# Patient Record
Sex: Male | Born: 1950 | Race: Black or African American | Hispanic: No | Marital: Married | Smoking: Current every day smoker
Health system: Southern US, Community
[De-identification: ages and names within clinical notes are randomized; demographics above are authoritative.]

## PROBLEM LIST (undated history)

## (undated) DIAGNOSIS — H269 Unspecified cataract: Secondary | ICD-10-CM

## (undated) DIAGNOSIS — K219 Gastro-esophageal reflux disease without esophagitis: Secondary | ICD-10-CM

## (undated) DIAGNOSIS — R911 Solitary pulmonary nodule: Secondary | ICD-10-CM

## (undated) DIAGNOSIS — I10 Essential (primary) hypertension: Secondary | ICD-10-CM

## (undated) HISTORY — DX: Gastro-esophageal reflux disease without esophagitis: K21.9

## (undated) HISTORY — DX: Solitary pulmonary nodule: R91.1

## (undated) HISTORY — PX: EYE SURGERY: SHX253

## (undated) HISTORY — DX: Essential (primary) hypertension: I10

## (undated) HISTORY — DX: Unspecified cataract: H26.9

---

## 1997-09-09 ENCOUNTER — Encounter: Admission: RE | Admit: 1997-09-09 | Discharge: 1997-09-09 | Payer: Self-pay | Admitting: Family Medicine

## 1997-10-11 ENCOUNTER — Encounter: Admission: RE | Admit: 1997-10-11 | Discharge: 1997-10-11 | Payer: Self-pay | Admitting: Sports Medicine

## 1997-10-21 ENCOUNTER — Ambulatory Visit (HOSPITAL_COMMUNITY): Admission: RE | Admit: 1997-10-21 | Discharge: 1997-10-21 | Payer: Self-pay | Admitting: Family Medicine

## 1997-11-10 ENCOUNTER — Encounter: Admission: RE | Admit: 1997-11-10 | Discharge: 1997-11-10 | Payer: Self-pay | Admitting: Family Medicine

## 1997-12-13 ENCOUNTER — Encounter: Admission: RE | Admit: 1997-12-13 | Discharge: 1997-12-13 | Payer: Self-pay | Admitting: Sports Medicine

## 1998-11-02 ENCOUNTER — Encounter: Admission: RE | Admit: 1998-11-02 | Discharge: 1998-11-02 | Payer: Self-pay | Admitting: Family Medicine

## 1998-12-05 ENCOUNTER — Encounter: Admission: RE | Admit: 1998-12-05 | Discharge: 1998-12-05 | Payer: Self-pay | Admitting: Sports Medicine

## 1999-09-27 ENCOUNTER — Encounter: Admission: RE | Admit: 1999-09-27 | Discharge: 1999-09-27 | Payer: Self-pay | Admitting: Family Medicine

## 2000-02-26 ENCOUNTER — Encounter: Admission: RE | Admit: 2000-02-26 | Discharge: 2000-02-26 | Payer: Self-pay | Admitting: Sports Medicine

## 2000-02-29 ENCOUNTER — Encounter: Admission: RE | Admit: 2000-02-29 | Discharge: 2000-02-29 | Payer: Self-pay | Admitting: Family Medicine

## 2000-12-31 ENCOUNTER — Encounter: Admission: RE | Admit: 2000-12-31 | Discharge: 2000-12-31 | Payer: Self-pay | Admitting: Family Medicine

## 2001-01-14 ENCOUNTER — Encounter: Admission: RE | Admit: 2001-01-14 | Discharge: 2001-01-14 | Payer: Self-pay | Admitting: Family Medicine

## 2001-03-20 ENCOUNTER — Encounter: Admission: RE | Admit: 2001-03-20 | Discharge: 2001-03-20 | Payer: Self-pay | Admitting: Family Medicine

## 2001-04-24 ENCOUNTER — Encounter: Admission: RE | Admit: 2001-04-24 | Discharge: 2001-04-24 | Payer: Self-pay | Admitting: Family Medicine

## 2001-07-06 ENCOUNTER — Encounter: Payer: Self-pay | Admitting: Emergency Medicine

## 2001-07-06 ENCOUNTER — Emergency Department (HOSPITAL_COMMUNITY): Admission: EM | Admit: 2001-07-06 | Discharge: 2001-07-06 | Payer: Self-pay | Admitting: Emergency Medicine

## 2002-04-29 ENCOUNTER — Encounter: Admission: RE | Admit: 2002-04-29 | Discharge: 2002-04-29 | Payer: Self-pay | Admitting: Family Medicine

## 2002-05-06 ENCOUNTER — Encounter: Admission: RE | Admit: 2002-05-06 | Discharge: 2002-05-06 | Payer: Self-pay | Admitting: Family Medicine

## 2002-05-31 ENCOUNTER — Encounter: Admission: RE | Admit: 2002-05-31 | Discharge: 2002-05-31 | Payer: Self-pay | Admitting: Family Medicine

## 2002-12-20 ENCOUNTER — Encounter: Admission: RE | Admit: 2002-12-20 | Discharge: 2002-12-20 | Payer: Self-pay | Admitting: Family Medicine

## 2003-03-19 ENCOUNTER — Emergency Department (HOSPITAL_COMMUNITY): Admission: EM | Admit: 2003-03-19 | Discharge: 2003-03-20 | Payer: Self-pay | Admitting: Emergency Medicine

## 2003-03-26 ENCOUNTER — Emergency Department (HOSPITAL_COMMUNITY): Admission: AD | Admit: 2003-03-26 | Discharge: 2003-03-26 | Payer: Self-pay | Admitting: Family Medicine

## 2005-07-04 ENCOUNTER — Ambulatory Visit: Payer: Self-pay | Admitting: Sports Medicine

## 2005-07-25 ENCOUNTER — Ambulatory Visit: Payer: Self-pay | Admitting: Family Medicine

## 2006-04-10 DIAGNOSIS — I10 Essential (primary) hypertension: Secondary | ICD-10-CM | POA: Insufficient documentation

## 2006-04-10 DIAGNOSIS — E7800 Pure hypercholesterolemia, unspecified: Secondary | ICD-10-CM | POA: Insufficient documentation

## 2006-04-10 DIAGNOSIS — M549 Dorsalgia, unspecified: Secondary | ICD-10-CM

## 2006-04-10 DIAGNOSIS — F17200 Nicotine dependence, unspecified, uncomplicated: Secondary | ICD-10-CM

## 2006-04-10 HISTORY — DX: Dorsalgia, unspecified: M54.9

## 2006-04-25 ENCOUNTER — Ambulatory Visit: Payer: Self-pay | Admitting: Family Medicine

## 2006-07-11 ENCOUNTER — Telehealth (INDEPENDENT_AMBULATORY_CARE_PROVIDER_SITE_OTHER): Payer: Self-pay | Admitting: Family Medicine

## 2006-10-12 ENCOUNTER — Ambulatory Visit: Payer: Self-pay | Admitting: Family Medicine

## 2006-10-12 ENCOUNTER — Encounter: Payer: Self-pay | Admitting: Emergency Medicine

## 2006-10-12 ENCOUNTER — Observation Stay (HOSPITAL_COMMUNITY): Admission: EM | Admit: 2006-10-12 | Discharge: 2006-10-13 | Payer: Self-pay | Admitting: Family Medicine

## 2006-10-16 ENCOUNTER — Ambulatory Visit: Payer: Self-pay | Admitting: Family Medicine

## 2006-10-16 DIAGNOSIS — F528 Other sexual dysfunction not due to a substance or known physiological condition: Secondary | ICD-10-CM | POA: Insufficient documentation

## 2006-10-16 HISTORY — DX: Other sexual dysfunction not due to a substance or known physiological condition: F52.8

## 2006-10-17 ENCOUNTER — Telehealth: Payer: Self-pay | Admitting: *Deleted

## 2006-10-17 ENCOUNTER — Encounter: Payer: Self-pay | Admitting: *Deleted

## 2007-04-08 ENCOUNTER — Emergency Department (HOSPITAL_COMMUNITY): Admission: EM | Admit: 2007-04-08 | Discharge: 2007-04-08 | Payer: Self-pay | Admitting: Emergency Medicine

## 2007-08-29 ENCOUNTER — Emergency Department (HOSPITAL_COMMUNITY): Admission: EM | Admit: 2007-08-29 | Discharge: 2007-08-30 | Payer: Self-pay | Admitting: Emergency Medicine

## 2007-11-02 ENCOUNTER — Emergency Department (HOSPITAL_COMMUNITY): Admission: EM | Admit: 2007-11-02 | Discharge: 2007-11-02 | Payer: Self-pay | Admitting: Emergency Medicine

## 2007-11-12 ENCOUNTER — Ambulatory Visit: Payer: Self-pay | Admitting: Family Medicine

## 2007-11-12 ENCOUNTER — Encounter (INDEPENDENT_AMBULATORY_CARE_PROVIDER_SITE_OTHER): Payer: Self-pay | Admitting: Family Medicine

## 2007-11-19 ENCOUNTER — Encounter: Payer: Self-pay | Admitting: *Deleted

## 2007-11-19 LAB — CONVERTED CEMR LAB
ALT: <8 units/L (ref 0–53)
AST: 13 units/L (ref 0–37)
Albumin: 4.2 g/dL (ref 3.5–5.2)
Alkaline Phosphatase: 101 units/L (ref 39–117)
BUN: 12 mg/dL (ref 6–23)
CO2: 25 meq/L (ref 19–32)
Calcium: 10 mg/dL (ref 8.4–10.5)
Chloride: 107 meq/L (ref 96–112)
Cholesterol: 251 mg/dL — ABNORMAL HIGH (ref 0–200)
Creatinine, Ser: 0.89 mg/dL (ref 0.40–1.50)
Glucose, Bld: 88 mg/dL (ref 70–99)
HDL: 42 mg/dL (ref >39)
LDL Cholesterol: 189 mg/dL — ABNORMAL HIGH (ref 0–99)
PSA: 3.9 ng/mL (ref 0.10–4.00)
Potassium: 3.7 meq/L (ref 3.5–5.3)
Sodium: 143 meq/L (ref 135–145)
Total Bilirubin: 1 mg/dL (ref 0.3–1.2)
Total CHOL/HDL Ratio: 6
Total Protein: 7 g/dL (ref 6.0–8.3)
Triglycerides: 100 mg/dL (ref <150)
VLDL: 20 mg/dL (ref 0–40)

## 2008-08-23 ENCOUNTER — Ambulatory Visit: Payer: Self-pay | Admitting: Family Medicine

## 2008-08-23 DIAGNOSIS — K219 Gastro-esophageal reflux disease without esophagitis: Secondary | ICD-10-CM | POA: Insufficient documentation

## 2009-03-29 ENCOUNTER — Ambulatory Visit: Payer: Self-pay | Admitting: Family Medicine

## 2009-03-29 ENCOUNTER — Encounter: Payer: Self-pay | Admitting: Family Medicine

## 2009-03-29 LAB — CONVERTED CEMR LAB
ALT: 9 units/L (ref 0–53)
AST: 21 units/L (ref 0–37)
Albumin: 4.4 g/dL (ref 3.5–5.2)
Alkaline Phosphatase: 99 units/L (ref 39–117)
BUN: 6 mg/dL (ref 6–23)
CO2: 25 meq/L (ref 19–32)
Calcium: 10.2 mg/dL (ref 8.4–10.5)
Chloride: 103 meq/L (ref 96–112)
Cholesterol: 259 mg/dL — ABNORMAL HIGH (ref 0–200)
Creatinine, Ser: 0.97 mg/dL (ref 0.40–1.50)
Glucose, Bld: 83 mg/dL (ref 70–99)
HDL: 53 mg/dL (ref >39)
LDL Cholesterol: 194 mg/dL — ABNORMAL HIGH (ref 0–99)
PSA: 1.91 ng/mL (ref 0.10–4.00)
Potassium: 4.6 meq/L (ref 3.5–5.3)
Sodium: 140 meq/L (ref 135–145)
Total Bilirubin: 1.2 mg/dL (ref 0.3–1.2)
Total CHOL/HDL Ratio: 4.9
Total Protein: 7 g/dL (ref 6.0–8.3)
Triglycerides: 61 mg/dL (ref <150)
VLDL: 12 mg/dL (ref 0–40)

## 2009-04-09 ENCOUNTER — Encounter: Payer: Self-pay | Admitting: Family Medicine

## 2009-04-10 ENCOUNTER — Telehealth: Payer: Self-pay | Admitting: *Deleted

## 2009-04-26 ENCOUNTER — Ambulatory Visit: Payer: Self-pay | Admitting: Family Medicine

## 2009-11-15 ENCOUNTER — Encounter: Payer: Self-pay | Admitting: Psychology

## 2009-11-15 ENCOUNTER — Encounter: Payer: Self-pay | Admitting: Family Medicine

## 2009-11-15 ENCOUNTER — Ambulatory Visit: Payer: Self-pay | Admitting: Family Medicine

## 2009-11-15 DIAGNOSIS — M25569 Pain in unspecified knee: Secondary | ICD-10-CM | POA: Insufficient documentation

## 2009-11-15 LAB — CONVERTED CEMR LAB
BUN: 8 mg/dL (ref 6–23)
CO2: 26 meq/L (ref 19–32)
Calcium: 9.8 mg/dL (ref 8.4–10.5)
Chloride: 104 meq/L (ref 96–112)
Creatinine, Ser: 0.85 mg/dL (ref 0.40–1.50)
Direct LDL: 207 mg/dL — ABNORMAL HIGH
Glucose, Bld: 91 mg/dL (ref 70–99)
Potassium: 3.4 meq/L — ABNORMAL LOW (ref 3.5–5.3)
Sodium: 139 meq/L (ref 135–145)

## 2009-11-16 ENCOUNTER — Encounter: Payer: Self-pay | Admitting: Family Medicine

## 2009-11-16 ENCOUNTER — Telehealth: Payer: Self-pay | Admitting: Family Medicine

## 2009-11-19 ENCOUNTER — Emergency Department (HOSPITAL_COMMUNITY): Admission: EM | Admit: 2009-11-19 | Discharge: 2009-11-20 | Payer: Self-pay | Admitting: Emergency Medicine

## 2009-11-29 ENCOUNTER — Emergency Department (HOSPITAL_COMMUNITY): Admission: EM | Admit: 2009-11-29 | Discharge: 2009-11-29 | Payer: Self-pay | Admitting: Emergency Medicine

## 2009-12-21 ENCOUNTER — Observation Stay (HOSPITAL_COMMUNITY): Admission: EM | Admit: 2009-12-21 | Discharge: 2009-12-22 | Payer: Self-pay | Admitting: Emergency Medicine

## 2009-12-21 ENCOUNTER — Encounter: Payer: Self-pay | Admitting: Family Medicine

## 2009-12-26 ENCOUNTER — Encounter: Payer: Self-pay | Admitting: Family Medicine

## 2009-12-26 LAB — CONVERTED CEMR LAB
HDL: 46 mg/dL
Hemoglobin: 18.4 g/dL
Hgb A1c MFr Bld: 5.6 %
LDL Cholesterol: 157 mg/dL
Potassium: 4.4 meq/L
Sodium: 137 meq/L
TSH: 2.3 microintl units/mL

## 2009-12-28 ENCOUNTER — Encounter: Payer: Self-pay | Admitting: Family Medicine

## 2009-12-28 ENCOUNTER — Ambulatory Visit: Payer: Self-pay | Admitting: Family Medicine

## 2009-12-28 LAB — CONVERTED CEMR LAB
ALT: 11 units/L (ref 0–53)
AST: 11 units/L (ref 0–37)
Alkaline Phosphatase: 105 units/L (ref 39–117)
BUN: 11 mg/dL (ref 6–23)
Creatinine, Ser: 0.87 mg/dL (ref 0.40–1.50)
Potassium: 3.4 meq/L — ABNORMAL LOW (ref 3.5–5.3)

## 2010-01-02 ENCOUNTER — Encounter (INDEPENDENT_AMBULATORY_CARE_PROVIDER_SITE_OTHER): Payer: Self-pay | Admitting: *Deleted

## 2010-01-03 ENCOUNTER — Encounter: Payer: Self-pay | Admitting: *Deleted

## 2010-01-19 ENCOUNTER — Ambulatory Visit (HOSPITAL_COMMUNITY)
Admission: RE | Admit: 2010-01-19 | Discharge: 2010-01-19 | Payer: Self-pay | Source: Home / Self Care | Attending: Family Medicine | Admitting: Family Medicine

## 2010-01-19 ENCOUNTER — Ambulatory Visit: Payer: Self-pay | Admitting: Family Medicine

## 2010-01-23 ENCOUNTER — Ambulatory Visit: Payer: Self-pay

## 2010-01-26 ENCOUNTER — Encounter: Payer: Self-pay | Admitting: Family Medicine

## 2010-03-13 NOTE — Letter (Signed)
Summary: Generic Letter  [facility]  [address]   [place], [place] [address]   Phone: [phone]  Fax: [phone]    11/16/2009  [facility] [address] [place], [place]  [address]  Dear Mr. WITTLER,   Your ncholesterol level was high.  Your LDL (bad cholesterol) was 751.  We want it to be less than 130 in you.  I have changed your cholesterol medicine to Lipitor 40mg . I would like you to go to your pharmacy and pick up this medication.  It is name brand only for the next 3 months.  Soon it will be generic.  Call me back when you get this medication as we need to talk about how to reduce your chances for heart attacks and strokes.     Sincerely,   [name] MD  Appended Document: Generic Letter mailed.

## 2010-03-13 NOTE — Letter (Signed)
Summary: Generic Letter  [facility]  [address]   [place], [place] [address]   Phone: [phone]  Fax: [phone]    01/03/2010 MRN: [id number]  [address] [place], [place]  [address]  Dear Mr. LAFAVOR,  You have been scheduled for an Exercise Treadmill Test with Dr. Denny Levy on Monday December 19th at 11:30 am.  The location of the test is at [facility] in the [facility].  Please review the enclosed instructions prior to your appointment and let us if you have any questions.         Sincerely,   Dennison Nancy RN [facility]

## 2010-03-13 NOTE — Assessment & Plan Note (Signed)
Summary: Behavioral Medicine Student Consultation   Primary Care Provider:  Eustaquio Boyden  MD   History of Present Illness: [name] is a 60 year old African American male with a history of smoking. Mr. [name] reported smoking up to two packs per day approximately three months ago. However, in these last three months, he has successfully reduced his smoking intake to one pack per day. Mr. [name] reports  that he has been successful in his smoking reduciton due to not smoking at home. He further reported that he needs to quit smoking for his health and because his granddaughter has been asking him to quit smoking. Mr. [name] reported quitting smoking for up to one month in the past, but started smoking again as his coworkers and friends are chronic smokers.   Allergies: No Known Drug Allergies   Impression & Recommendations:  Problem # 1:  TOBACCO DEPENDENCE (ICD-305.1) [name] reported only possessing three cigarettes currently and indicated these would be his last three. He is in the action phase of change and reports that his quit day is today. He was provided with a brochure for the smoking cessation class offered during the 4th Tuesday of every month and the 1-8--Quit-Now number. [name] was receptive to the informaton and reported that he will be attending the class and may use the number if he feels like he needs a cigarette in the moment.   [name] may benefit from continued follow-up regarding his smoking cessation. Specifically, discussing how smoking relates to his health and his granddaughters health may be particulalry helpful as he reports these are motivators for quitting. If [name] indicates diffulties smoking, discussion of how to manage his smoke intake around his coworkers and peers may be particularly helpful.   [name]  Behavioral Medicine Student  November 15, 2009 9:28 AM   Complete Medication List: 1)  Hydrochlorothiazide 25 Mg Tabs  (Hydrochlorothiazide) .... One by mouth qd 2)  Adult Aspirin Low Strength 81 Mg Tbdp (Aspirin) .... Take 1 every day 3)  Simvastatin 40 Mg Tabs (Simvastatin) .... Take 1 every day 4)  Levitra 10 Mg Tabs (Vardenafil hcl) .Marland Kitchen.. 1 by mouth daily prn

## 2010-03-13 NOTE — Progress Notes (Signed)
  Medications Added LIPITOR 40 MG TABS (ATORVASTATIN CALCIUM) 1 by mouth qhs       Phone Note Outgoing Call   Call placed by: [name] MD,  November 16, 2009 9:00 AM Summary of Call: Attempted to call about cholesterol results (LDL 200).  Phone number does not work.  Have changed cholesterol medication to lipitor 40.  Will send a letter.  Initial call taken by: [name] MD,  November 16, 2009 9:01 AM    New/Updated Medications: LIPITOR 40 MG TABS (ATORVASTATIN CALCIUM) 1 by mouth qhs Prescriptions: LIPITOR 40 MG TABS (ATORVASTATIN CALCIUM) 1 by mouth qhs  #30 x 6   Entered and Authorized by:   [name] MD   Signed by:   [name] MD on 11/16/2009   Method used:   [facility]        [facility] [phone]* (retail)       [address]       [place], [place]  [address]       Ph: [phone] or [phone]       Fax: [phone]   RxID:   [id number]

## 2010-03-13 NOTE — Assessment & Plan Note (Signed)
Summary: f.u HTN, HLD, tobacco   Vital Signs:  Patient profile:   60 year old male Height:      69.75 inches Weight:      171 pounds BMI:     24.80 Temp:     97.6 degrees F oral Pulse rate:   77 / minute BP sitting:   119 / 78  (right arm) Cuff size:   regular  Vitals Entered By: Tessie Fass CMA (April 26, 2009 9:00 AM) CC: F/U Is Patient Diabetic? No Pain Assessment Patient in pain? no        Primary Care Provider:  Eustaquio Boyden  MD  CC:  F/U.  History of Present Illness: CC: f/u HTN  1. HTN - comlpiant with meds, no chest pain/tightness, HA, SOB, leg swelling.  2. HLD - no myalgias, compliant with meds.  friend had heart attack, so Gailen wants to watch his cholesterol closely.  3. tobacco - 1/2 ppd.  has self cut down from 1 ppd.  not ready to set quit date.  Habits & Providers  Alcohol-Tobacco-Diet     Tobacco Status: current     Tobacco Counseling: to quit use of tobacco products     Cigarette Packs/Day: 0.5  Current Medications (verified): 1)  Hydrochlorothiazide 25 Mg  Tabs (Hydrochlorothiazide) .... One By Mouth Qd 2)  Adult Aspirin Low Strength 81 Mg  Tbdp (Aspirin) .... Take 1 Every Day 3)  Simvastatin 40 Mg  Tabs (Simvastatin) .... Take 1 Every Day 4)  Viagra 50 Mg Tabs (Sildenafil Citrate) .... Use One Pill As Directed 30 Min Prior To Relations  Allergies (verified): No Known Drug Allergies  Past History:  Past Medical History: Current Problems:  SCREENING FOR MALIGNANT NEOPLASM, PROSTATE (ICD-V76.44) ANTIHYPERLIPIDEMIC USE, LONG TERM (ICD-V58.69) CARBUNCLE/FURUNCLE NOS (ICD-680.9) TOBACCO DEPENDENCE (ICD-305.1) HYPERTENSION, BENIGN SYSTEMIC (ICD-401.1) HYPERCHOLESTEROLEMIA (ICD-272.0) BACK PAIN, LOW (ICD-724.2) Cataracts  Past Surgical History: Exercise Stress Test: low probability of signif CAD; achieved 12.3 METS - 10/12/1997 Flex sig 02/2000, clear to 45 cm. h/o cataract surgery x3, keep coming back (last 2009) PMH-FH-SH  reviewed for relevance  Social History: Packs/Day:  0.5  Physical Exam  General:  Well-developed,well-nourished,in no acute distress; alert,appropriate and cooperative throughout examination Lungs:  Normal respiratory effort, chest expands symmetrically. Lungs are clear to auscultation, no crackles or wheezes. Heart:  Normal rate and regular rhythm. S1 and S2 normal without gallop, murmur, click, rub or other extra sounds. Extremities:  no edema   Impression & Recommendations:  Problem # 1:  HYPERTENSION, BENIGN SYSTEMIC (ICD-401.1)  optimal control on 25mg  HCTZ.  consider checking BMP next visit. His updated medication list for this problem includes:    Hydrochlorothiazide 25 Mg Tabs (Hydrochlorothiazide) ..... One by mouth qd  BP today: 119/78 Prior BP: 136/80 (03/29/2009)  Labs Reviewed: K+: 4.6 (03/29/2009) Creat: : 0.97 (03/29/2009)   Chol: 259 (03/29/2009)   HDL: 53 (03/29/2009)   LDL: 194 (03/29/2009)   TG: 61 (03/29/2009)  Orders: [facility]- Est  Level 4 ([address])  Problem # 2:  HYPERCHOLESTEROLEMIA (ICD-272.0)  reviewed last FLP and updated address. His updated medication list for this problem includes:    Simvastatin 40 Mg Tabs (Simvastatin) .Marland Kitchen... Take 1 every day  Labs Reviewed: SGOT: 21 (03/29/2009)   SGPT: 9 (03/29/2009)   HDL:53 (03/29/2009), 42 (11/12/2007)  LDL:194 (03/29/2009), 189 (11/12/2007)  Chol:259 (03/29/2009), 251 (11/12/2007)  Trig:61 (03/29/2009), 100 (11/12/2007)  Orders: [facility]- Est  Level 4 ([address])  Problem # 3:  TOBACCO DEPENDENCE (ICD-305.1)  Encouraged smoking  cessation  Orders: [facility]- Est  Level 4 ([address])  Problem # 4:  SPECIAL SCREENING FOR MALIGNANT NEOPLASMS COLON (ICD-V76.51) to return stool cards next visit (provided with cards again today)  Complete Medication List: 1)  Hydrochlorothiazide 25 Mg Tabs (Hydrochlorothiazide) .... One by mouth qd 2)  Adult Aspirin Low Strength 81 Mg Tbdp (Aspirin) .... Take 1 every day 3)  Simvastatin 40  Mg Tabs (Simvastatin) .... Take 1 every day 4)  Viagra 50 Mg Tabs (Sildenafil citrate) .... Use one pill as directed 30 min prior to relations  Patient Instructions: 1)  Please return in 3 months for follow up. 2)  Keep doing as well as you are with cutting back on smoking and your cholesterol and blood pressure medicines.   Prevention & Chronic Care Immunizations   Influenza vaccine: Fluvax 3+  (03/29/2009)   Influenza vaccine due: 10/12/2009    Tetanus booster: 06/11/2005: Done.   Tetanus booster due: 06/12/2015    Pneumococcal vaccine: Pneumovax  (03/29/2009)   Pneumococcal vaccine due: 03/29/2014    H. zoster vaccine: Not documented  Colorectal Screening   Hemoccult: Not documented   Hemoccult action/deferral: Ordered  (03/29/2009)    Colonoscopy: Not documented   Colonoscopy action/deferral: Refused  (03/29/2009)  Other Screening   PSA: 1.91  (03/29/2009)   Smoking status: current  (04/26/2009)   Smoking cessation counseling: yes  (04/25/2006)  Lipids   Total Cholesterol: 259  (03/29/2009)   LDL: 194  (03/29/2009)   LDL Direct: Not documented   HDL: 53  (03/29/2009)   Triglycerides: 61  (03/29/2009)    SGOT (AST): 21  (03/29/2009)   SGPT (ALT): 9  (03/29/2009)   Alkaline phosphatase: 99  (03/29/2009)   Total bilirubin: 1.2  (03/29/2009)    Lipid flowsheet reviewed?: Yes   Progress toward LDL goal: Improved  Hypertension   Last Blood Pressure: 119 / 78  (04/26/2009)   Serum creatinine: 0.97  (03/29/2009)   Serum potassium 4.6  (03/29/2009)    Hypertension flowsheet reviewed?: Yes   Progress toward BP goal: At goal  Self-Management Support :   Personal Goals (by the next clinic visit) :      Personal blood pressure goal: 140/90  (03/29/2009)     Personal LDL goal: 130  (03/29/2009)    Hypertension self-management support: Not documented    Lipid self-management support: Lipid monitoring log, Written self-care plan, Education handout  (03/29/2009)

## 2010-03-13 NOTE — Assessment & Plan Note (Signed)
Summary: hospital f/u   Vital Signs:  Patient profile:   60 year old male Height:      69.75 inches Weight:      168 pounds BMI:     24.37 Pulse rate:   78 / minute BP sitting:   125 / 76  (right arm) Cuff size:   regular  Vitals Entered By: Tessie Fass CMA (December 28, 2009 11:00 AM) CC: hospital f/u Is Patient Diabetic? No   Primary Care Provider:  [name] MD  CC:  hospital f/u.  History of Present Illness: [name] presents to clinic today to follow up his recent hospitilization.   He was seen at [facility] for chest pain. His pain was exertional and releived by nitroglycerin. However his workup was negative for CAD and his pain was though to be due to GERD. He feels well after discharge and is now on Protonix. He has not had recurrence of chest pain.   No complaints today.   HTN: Not measuring BP. Taking HCTZ. No headache, vision changes, syncope.  Lipids: No myalgias. Currently taking 40mg  lipitor daily.  Smoking: Has quit at the hospitilization. Using nicotine gum.   Habits & Providers  Alcohol-Tobacco-Diet     Alcohol drinks/day: 0     Tobacco Status: quit < 6 months  Current Problems (verified): 1)  Chest Pain, Acute  (ICD-786.50) 2)  Knee Pain, Right  (ICD-719.46) 3)  Routine General Medical Exam@health  Care Facl  (ICD-V70.0) 4)  Gerd  (ICD-530.81) 5)  Tobacco Dependence  (ICD-305.1) 6)  Hypertension, Benign Systemic  (ICD-401.1) 7)  Hypercholesterolemia  (ICD-272.0) 8)  Special Screening For Malignant Neoplasms Colon  (ICD-V76.51) 9)  Fh of Hx, Family, Malignancy, Gi Tract  (ICD-V16.0) 10)  Erectile Dysfunction  (ICD-302.72) 11)  H/F Chest Pain, Atypical  (ICD-786.59) 12)  Screening For Malignant Neoplasm, Prostate  (ICD-V76.44) 13)  Back Pain, Low  (ICD-724.2)  Current Medications (verified): 1)  Hydrochlorothiazide 25 Mg  Tabs (Hydrochlorothiazide) .... One By Mouth Qd 2)  Adult Aspirin Low Strength 81 Mg  Tbdp (Aspirin) .... Take 1 Every Day 3)   Lipitor 80 Mg Tabs (Atorvastatin Calcium) .Marland Kitchen.. 1 By Mouth Qhs 4)  Polyethylene Glycol 3350  Powd (Polyethylene Glycol 3350) .Marland Kitchen.. 1 Cap 1-2 Time Daily As Needed For Constipation. Disp 1 Large Bottle 5)  Nitrostat 0.4 Mg Subl (Nitroglycerin) .Marland Kitchen.. 1 Sl Q15min X3 As Needed Chest Pain. Disp 1 Tube  Allergies (verified): No Known Drug Allergies  Past History:  Past Medical History: Last updated: 04/26/2009 Current Problems:  SCREENING FOR MALIGNANT NEOPLASM, PROSTATE (ICD-V76.44) ANTIHYPERLIPIDEMIC USE, LONG TERM (ICD-V58.69) CARBUNCLE/FURUNCLE NOS (ICD-680.9) TOBACCO DEPENDENCE (ICD-305.1) HYPERTENSION, BENIGN SYSTEMIC (ICD-401.1) HYPERCHOLESTEROLEMIA (ICD-272.0) BACK PAIN, LOW (ICD-724.2) Cataracts  Past Surgical History: Last updated: 04/26/2009 Exercise Stress Test: low probability of signif CAD; achieved 12.3 METS - 10/12/1997 Flex sig 02/2000, clear to 45 cm. h/o cataract surgery x3, keep coming back (last 2009)  Social History: Last updated: 12/28/2009 Lives with wife, 4 grandchildren; Has three children. ; H/o 3 PPD smoker, quit (Nov, 2011), interested in stopping smoking; Works on Veterinary surgeon.  No EtOH, no rec drugs.  Risk Factors: Smoking Status: quit < 6 months (12/28/2009) Packs/Day: 0.5 (12/21/2009)  Social History: Lives with wife, 4 grandchildren; Has three children. ; H/o 3 PPD smoker, quit (Nov, 2011), interested in stopping smoking; Works on Veterinary surgeon.  No EtOH, no rec drugs.Smoking Status:  quit < 6 months  Review of Systems  The patient denies anorexia, fever,  weight loss, chest pain, syncope, dyspnea on exertion, headaches, abdominal pain, severe indigestion/heartburn, and difficulty walking.    Physical Exam  General:  VS noted.  Well NAD Eyes:  h/o bilateral eye surgery for cataracts - implanted lens visible, erythema of conjunctiva which seems chronic in nature. Mouth:  MMM Lungs:  Normal respiratory effort, chest expands  symmetrically. Lungs are clear to auscultation, no crackles or wheezes. Heart:  Normal rate and regular rhythm. S1 and S2 normal without gallop, murmur, click, rub or other extra sounds. Abdomen:  Bowel sounds positive,abdomen soft and non-tender without masses, organomegaly or hernias noted. Extremities:  no edema Neurologic:  alert & oriented X3, cranial nerves II-XII intact, strength normal in all extremities, and sensation intact to light touch.     Impression & Recommendations:  Problem # 1:  CHEST PAIN, ACUTE (ICD-786.50) Assessment Improved  Follow up admission from chest pain.  I am not convinced this his pain is entirely due to GERD. I feel that he would benifit from further risk factor stratification. Will send for exercise stress test as the patient states that he can run and has a normal EKG.  Nitroglycerine SL PRn and stop levitra. Pt expressed understanding.  Will follow up in 1 month.   Orders: [facility]- Est  Level 4 ([address])  Problem # 2:  GERD (ICD-530.81) Assessment: Improved  Will continue with protonix as the patient perfers it and it seems to work better than omeprazole. Will ask pt to ask the pharmacist which high potency PPI is perferred in his plan.  No red flags for esophageal cancer at this time. May consider EGD in the future.  Will follow up in 1 month.   Orders: [facility]- Est  Level 4 ([address])  Problem # 3:  TOBACCO DEPENDENCE (ICD-305.1) Assessment: Improved  Has currently stoped smoking. However we will continue to ask about it.  Offered reassurance and other agents. Happy with nicotine gum and quit line.  Follow up in 1 month.   Orders: [facility]- Est  Level 4 ([address])  Problem # 4:  HYPERTENSION, BENIGN SYSTEMIC (ICD-401.1) Assessment: Unchanged No changes planned.   His updated medication list for this problem includes:    Hydrochlorothiazide 25 Mg Tabs (Hydrochlorothiazide) ..... One by mouth qd  BP today: 125/76 Prior BP: 135/78 (12/21/2009)  Labs  Reviewed: K+: 3.4 (11/15/2009) Creat: : 0.85 (11/15/2009)   Chol: 259 (03/29/2009)   HDL: 53 (03/29/2009)   LDL: 194 (03/29/2009)   TG: 61 (03/29/2009)  Problem # 5:  HYPERCHOLESTEROLEMIA (ICD-272.0) Assessment: Improved LDL imoroved from the last check. However not at goal of <130. Will increase to 80mg .  Will also check CMP today to assess liver enzymes.   His updated medication list for this problem includes:    Lipitor 80 Mg Tabs (Atorvastatin calcium) .Marland Kitchen... 1 by mouth qhs  Orders: Comp Met-[facility] ([address]08-65784) [facility]- Est  Level 4 ([address])  Complete Medication List: 1)  Hydrochlorothiazide 25 Mg Tabs (Hydrochlorothiazide) .... One by mouth qd 2)  Adult Aspirin Low Strength 81 Mg Tbdp (Aspirin) .... Take 1 every day 3)  Lipitor 80 Mg Tabs (Atorvastatin calcium) .Marland Kitchen.. 1 by mouth qhs 4)  Polyethylene Glycol 3350 Powd (Polyethylene glycol 3350) .Marland Kitchen.. 1 cap 1-2 time daily as needed for constipation. disp 1 large bottle 5)  Nitrostat 0.4 Mg Subl (Nitroglycerin) .Marland Kitchen.. 1 sl q21min x3 as needed chest pain. disp 1 tube  Patient Instructions: 1)  Thank you for seeing me today. 2)  Take the mirlax tiwce daily until  you have soft BMs.  Go get colace OTC to soften the stools. Also get ducolax suppositories and use that today to have a BM. 3)  Ask you pharmacist which proton pump inhibitor that is stronger than omeprazole is perferred for your insurance. 4)  Start taking lipitor 80mg  every day. 5)  Schedule an appoinmtnet for an exercise stress test with Dr. Darrick Penna or McDiarmid in the next few weeks.  6)  Stop taking levitra.  7)  Use the nitroglycerin under the tongue for chest pain. You can use it up to 3 times every 5 mins. If you need to use it 3 times in a row call 911. 8)  If you have chest pain, difficulty breathing, fevers over 102 that does not get better with tylenol please call us or see a doctor.  9)  Please schedule a follow-up appointment in 1 month.  Prescriptions: NITROSTAT 0.4 MG SUBL  (NITROGLYCERIN) 1 sl q8min x3 as needed chest pain. Disp 1 tube  #1 x 1   Entered and Authorized by:   [name] MD   Signed by:   [name] MD on 12/29/2009   Method used:   [facility]        [facility] [phone]* (retail)       [address], [place]  [address]       Ph: [phone] or [phone]       Fax: [phone]   RxID:   [id number] POLYETHYLENE GLYCOL 3350  POWD (POLYETHYLENE GLYCOL 3350) 1 cap 1-2 time daily as needed for constipation. Disp 1 large bottle  #1 x 99   Entered and Authorized by:   [name] MD   Signed by:   [name] MD on 12/28/2009   Method used:   [facility]        [facility] [phone]* (retail)       [address] [place]  [address]       Ph: [phone] or [phone]       Fax: [phone]   RxID:   [id number] LIPITOR 80 MG TABS (ATORVASTATIN CALCIUM) 1 by mouth qhs  #30 x 6   Entered and Authorized by:   [name] MD   Signed by:   [name] MD on 12/28/2009   Method used:   [facility] to        [facility] [phone]* (retail)       [address], [place]  [address]       Ph: [phone] or [phone]       Fax: [phone]   RxID:   [id number]    Orders Added: 1)  Comp Met-[facility] [[id number]] 2)  [facility]- Est  Level 4 [[address]]     -  Date:  12/26/2009    NA 137    K 4.4    CREAT 0.9    GLU 90    LDL 157    HDL 46    TG 103    HGB 18.4    TSH 2.3    HgBA1c 5.6

## 2010-03-13 NOTE — Assessment & Plan Note (Signed)
Summary: CPE   Vital Signs:  Patient profile:   60 year old male Height:      69.75 inches Weight:      165.2 pounds BMI:     23.96 Temp:     97.9 degrees F oral Pulse rate:   77 / minute BP sitting:   136 / 80  (right arm) Cuff size:   regular  Vitals Entered By: Garen Grams LPN (March 29, 2009 8:48 AM) Is Patient Diabetic? No   Primary Care Provider:  Eustaquio Boyden  MD   History of Present Illness: CC: CPE  h/o HTN, HLD.  Not on meds except regular aspirin.  states took wife's HCTZ this mornign because knew was coming into doctor, and he ran out of his own several months ago.  not taking simvastatin.  Comes in for physical.    Still smoking 1ppd.  precontemplative.  Habits & Providers  Alcohol-Tobacco-Diet     Tobacco Status: current     Cigarette Packs/Day: 1.0  Current Medications (verified): 1)  Hydrochlorothiazide 25 Mg  Tabs (Hydrochlorothiazide) .... One By Mouth Qd 2)  Adult Aspirin Low Strength 81 Mg  Tbdp (Aspirin) .... Take 1 Every Day 3)  Simvastatin 40 Mg  Tabs (Simvastatin) .... Take 1 Every Day  Allergies (verified): No Known Drug Allergies  Past History:  Past medical, surgical, family and social histories (including risk factors) reviewed for relevance to current acute and chronic problems.  Past Medical History: Reviewed history from 04/25/2006 and no changes required. Current Problems:  SCREENING FOR MALIGNANT NEOPLASM, PROSTATE (ICD-V76.44) ANTIHYPERLIPIDEMIC USE, LONG TERM (ICD-V58.69) CARBUNCLE/FURUNCLE NOS (ICD-680.9) TOBACCO DEPENDENCE (ICD-305.1) HYPERTENSION, BENIGN SYSTEMIC (ICD-401.1) HYPERCHOLESTEROLEMIA (ICD-272.0) BACK PAIN, LOW (ICD-724.2)  Past Surgical History: Reviewed history from 08/23/2008 and no changes required. Exercise Stress Test: low probability of signif CAD; achieved 12.3 METS - 10/12/1997 Flex sig 02/2000, clear to 45 cm.  Family History: Reviewed history from 08/23/2008 and no changes  required. Father died of prostate, colon, lung CA passed when 23s. Also HTN. Mother died of heart disease (smoker), IDDM, CAD, HTN, PGM--unknown cancer  Social History: Reviewed history from 08/23/2008 and no changes required. Lives with wife, 4 grandchildren; Has three children. ; H/o 3 PPD smoker, down to 2 PPD (2010), interested in stopping smoking; Works on Veterinary surgeon.  No EtOH, no rec drugs.Packs/Day:  1.0  Physical Exam  General:  Well-developed,well-nourished,in no acute distress; alert,appropriate and cooperative throughout examination Lungs:  normal respiratory effort.   distant breath sounds throughout, but no wheezes, no crackles. Heart:  Normal rate and regular rhythm. S1 and S2 normal without gallop, murmur, click, rub or other extra sounds. Abdomen:  Bowel sounds positive,abdomen soft and non-tender without masses, organomegaly or hernias noted. Extremities:  no edema Skin:  Intact without suspicious lesions or rashes   Impression & Recommendations:  Problem # 1:  ROUTINE GENERAL MEDICAL EXAM@HEALTH  CARE FACL (ICD-V70.0)  phsyical today.  received flu and PNA shots.  needs Pneumovax at age 55 for last time.  Orders: [facility] - Est  40-64 yrs ([address])  Problem # 2:  TOBACCO DEPENDENCE (ICD-305.1)  Encouraged smoking cessation.  advised to have come back once more interested in discussing.  Orders: [facility] - Est  40-64 yrs ([address])  Problem # 3:  HYPERTENSION, BENIGN SYSTEMIC (ICD-401.1) advised to restart HCTZ.   His updated medication list for this problem includes:    Hydrochlorothiazide 25 Mg Tabs (Hydrochlorothiazide) ..... One by mouth qd  Orders: Lipid-[facility] ([id number]) Comp Met-[facility] [phone])  [facility] - Est  40-64 yrs ([address])  BP today: 136/80 Prior BP: 127/77 (08/23/2008)  Labs Reviewed: K+: 3.7 (11/12/2007) Creat: : 0.89 (11/12/2007)   Chol: 251 (11/12/2007)   HDL: 42 (11/12/2007)   LDL: 189 (11/12/2007)   TG: 100 (11/12/2007)  Problem # 4:   HYPERCHOLESTEROLEMIA (ICD-272.0)  advised to restart simvastatin.   His updated medication list for this problem includes:    Simvastatin 40 Mg Tabs (Simvastatin) .Marland Kitchen... Take 1 every day  Orders: Lipid-[facility] ([id number]) [facility] - Est  40-64 yrs [phone])  Labs Reviewed: SGOT: 13 (11/12/2007)   SGPT: <8 U/L (11/12/2007)   HDL:42 (11/12/2007)  LDL:189 (11/12/2007)  Chol:251 (11/12/2007)  Trig:100 (11/12/2007)  Problem # 5:  SPECIAL SCREENING FOR MALIGNANT NEOPLASMS COLON (ICD-V76.51) h/o prostate and colon cancer in family member, to do hemoccult instead of colonoscopy. Orders: [facility] - Est  40-64 yrs ([address]) Hemoccult Cards (Take Home) (Hemoccult Cards)  Complete Medication List: 1)  Hydrochlorothiazide 25 Mg Tabs (Hydrochlorothiazide) .... One by mouth qd 2)  Adult Aspirin Low Strength 81 Mg Tbdp (Aspirin) .... Take 1 every day 3)  Simvastatin 40 Mg Tabs (Simvastatin) .... Take 1 every day  Other Orders: PSA-[facility] ([id number]) Flu Vaccine 33yrs + [phone]) Admin 1st Vaccine (557[address]) Admin 1st Vaccine [facility]) [phone]) Pneumococcal Vaccine (70623) Admin of Any Addtl Vaccine (76283) Admin of Any Addtl Vaccine (State) [phone])  Patient Instructions: 1)  Please return in 1 month for follow up of HTN. 2)  Restart your water pill for blood pressure (hydrochlorothiazide) and cholesterol medicine (Simvastatin) daily.  I think you need both of these. 3)  You received a flu shot and pneumonia shot today. 4)  Please return your stool cards.  We checked prostate level today. 5)  Keep thinking about cutting back or quitting your smoking.  When you are ready to start, let Pattricia Boss know and come back to see me. 6)  remember it is important to take your medicines daily.  When you run out, call your pharmacy.  bring all your medicines to your next visit. 7)  Call clinic with questions.  Pleasure to see you today! Prescriptions: SIMVASTATIN 40 MG  TABS (SIMVASTATIN) Take 1 every day  #32 x 3   Entered and  Authorized by:   Eustaquio Boyden  MD   Signed by:   Eustaquio Boyden  MD on 03/29/2009   Method used:   [facility]        [facility] [phone]* (retail)       [address]       [place], [place]  [address]       Ph: [phone] or [phone]       Fax: [phone]   RxID:   [id number] HYDROCHLOROTHIAZIDE 25 MG  TABS (HYDROCHLOROTHIAZIDE) one by mouth qd  #32 x 3   Entered and Authorized by:   Eustaquio Boyden  MD   Signed by:   Eustaquio Boyden  MD on 03/29/2009   Method used:   [facility] to        [facility] [phone]* (retail)       [address] [place]  [address]       Ph: [phone] or [phone]       Fax: [phone]   RxID:   [id number]     Prevention & Chronic Care Immunizations   Influenza vaccine: Fluvax 3+  (03/29/2009)   Influenza vaccine due: 10/12/2009  Tetanus booster: 06/11/2005: Done.   Tetanus booster due: 06/12/2015    Pneumococcal vaccine: Pneumovax  (03/29/2009)   Pneumococcal vaccine due: 03/29/2014    H. zoster vaccine: Not documented  Colorectal Screening   Hemoccult: Not documented   Hemoccult action/deferral: Ordered  (03/29/2009)    Colonoscopy: Not documented   Colonoscopy action/deferral: Refused  (03/29/2009)  Other Screening   PSA: 3.90  (11/12/2007)   PSA ordered.   Smoking status: current  (03/29/2009)   Smoking cessation counseling: yes  (04/25/2006)  Lipids   Total Cholesterol: 251  (11/12/2007)   LDL: 189  (11/12/2007)   LDL Direct: Not documented   HDL: 42  (11/12/2007)   Triglycerides: 100  (11/12/2007)    SGOT (AST): 13  (11/12/2007)   SGPT (ALT): <8 U/L  (11/12/2007) CMP ordered    Alkaline phosphatase: 101  (11/12/2007)   Total bilirubin: 1.0  (11/12/2007)    Lipid flowsheet reviewed?: Yes   Progress toward LDL goal: Unchanged  Hypertension   Last Blood Pressure: 136 / 80  (03/29/2009)   Serum creatinine: 0.89  (11/12/2007)   Serum potassium 3.7   (11/12/2007) CMP ordered     Hypertension flowsheet reviewed?: Yes   Progress toward BP goal: At goal  Self-Management Support :   Personal Goals (by the next clinic visit) :      Personal blood pressure goal: 140/90  (03/29/2009)     Personal LDL goal: 130  (03/29/2009)    Hypertension self-management support: Not documented    Lipid self-management support: Lipid monitoring log, Written self-care plan, Education handout  (03/29/2009)   Lipid self-care plan printed.   Lipid education handout printed   Nursing Instructions: Provide Hemoccult cards with instructions (see order)    Pneumovax Vaccine    Vaccine Type: Pneumovax    Site: left deltoid    Mfr: Merck    Dose: 0.5 ml    Route: IM    Given by: Garen Grams LPN    Exp. Date: 06/01/2010    Lot #: [id number]    VIS given: 09/09/95 version given March 29, 2009.  Influenza Vaccine    Vaccine Type: Fluvax 3+    Site: right deltoid    Mfr: GlaxoSmithKline    Dose: 0.5 ml    Route: IM    Given by: Garen Grams LPN    Exp. Date: 08/10/2009    Lot #: [id number]    VIS given: 09/04/06 version given March 29, 2009.  Flu Vaccine Consent Questions    Do you have a history of severe allergic reactions to this vaccine? no    Any prior history of allergic reactions to egg and/or gelatin? no    Do you have a sensitivity to the preservative Thimersol? no    Do you have a past history of Guillan-Barre Syndrome? no    Do you currently have an acute febrile illness? no    Have you ever had a severe reaction to latex? no    Vaccine information given and explained to patient? yes

## 2010-03-13 NOTE — Progress Notes (Signed)
----   Converted from flag ---- ---- 04/09/2009 3:06 PM, Eustaquio Boyden  MD wrote: please send letter to patient. ------------------------------  mailed.

## 2010-03-13 NOTE — Miscellaneous (Signed)
  pt schd to see dr Huntley Dec neal on 12/9/11at 11:30 for ETT at [facility].  will contact nurse to mail pt blue protocol form and to inform him of his appt.

## 2010-03-13 NOTE — Letter (Signed)
Summary: Results Follow-up Letter  [facility]  [address]   [place], [place] [address]   Phone: [phone]  Fax: [phone]    04/09/2009  [address] [place], [place]  [address]  Dear [name],   The following are the results of your recent test(s):  Test     Result     Your kidney and liver function was normal.  Your sugars were also normal.  Your prostate level was normal at 1.91.   _________________________________________________________ Cholesterol LDL(Bad cholesterol):     578     Your goal is less than:     130    HDL (Good cholesterol):   53     Your goal is more than:      50 _________________________________________________________ Other Tests: Your cholesterol levels were high.  This likely means that you will need to continue taking simvastatin (medicine for cholesterol) daily.  Please call clinic with questions.  _________________________________________________________   Sincerely,  Eustaquio Boyden  MD [facility]           Appended Document: Results Follow-up Letter Letter returned unable to forward.

## 2010-03-13 NOTE — Assessment & Plan Note (Signed)
Summary: meet new dr/kh   Vital Signs:  Patient profile:   60 year old male Height:      69.75 inches Weight:      171.1 pounds BMI:     24.82 Temp:     98.0 degrees F oral Pulse rate:   67 / minute BP sitting:   162 / 84  (left arm) Cuff size:   regular  Vitals Entered By: Garen Grams LPN (November 15, 2009 8:45 AM) CC: Meet New MD Is Patient Diabetic? No Pain Assessment Patient in pain? yes     Location: right knee   Primary Care Provider:  [name] MD  CC:  Meet New MD.  History of Present Illness: [name] presents to clinic today to discuss: Hypertension Medications: HCTZ25.  Ran out a few days ago.  In interum feels OK Headache:No Pre/Syncope:No Chest Pain, Palpitation, Dyspnea:No Home BP: Not checking.  Knee Pain: Right, worse with extended time on feet and walking. 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  Right knee hurts and sometimes gives out. Some clicking. No catching or locking. No swelling noted.   Smoking: Smoking 1/2 PPD down from 2PPD.  Smokes mostly at work out doors.  Thinking about quitting. Wants to quit due to some coughing and trouble breathing especially at work. Has managed to quit for 1 month in the past. No hemotopysis or weight loss.   Erectile Dysfuction: Takes viargra 1 hr prior to sex. Works some of the time.  Has had episodes when he was unable to obtain an errection despite using the medication correctly.  Complains about the cost of the viagra.    Habits & Providers  Alcohol-Tobacco-Diet     Alcohol drinks/day: 0     Tobacco Status: current     Tobacco Counseling: to quit use of tobacco products     Cigarette Packs/Day: 0.5  Current Problems (verified): 1)  Knee Pain, Right  (ICD-719.46) 2)  Routine General Medical Exam@health  Care Facl  (ICD-V70.0) 3)  Gerd  (ICD-530.81) 4)  Tobacco Dependence  (ICD-305.1) 5)  Hypertension, Benign Systemic  (ICD-401.1) 6)  Hypercholesterolemia  (ICD-272.0) 7)  Special Screening For  Malignant Neoplasms Colon  (ICD-V76.51) 8)  Fh of Hx, Family, Malignancy, Gi Tract  (ICD-V16.0) 9)  Erectile Dysfunction  (ICD-302.72) 10)  H/F Chest Pain, Atypical  (ICD-786.59) 11)  Screening For Malignant Neoplasm, Prostate  (ICD-V76.44) 12)  Back Pain, Low  (ICD-724.2)  Current Medications (verified): 1)  Hydrochlorothiazide 25 Mg  Tabs (Hydrochlorothiazide) .... One By Mouth Qd 2)  Adult Aspirin Low Strength 81 Mg  Tbdp (Aspirin) .... Take 1 Every Day 3)  Simvastatin 40 Mg  Tabs (Simvastatin) .... Take 1 Every Day 4)  Levitra 10 Mg Tabs (Vardenafil Hcl) .Marland Kitchen.. 1 By Mouth Daily Prn  Allergies (verified): No Known Drug Allergies  Past History:  Past Medical History: Last updated: 04/26/2009 Current Problems:  SCREENING FOR MALIGNANT NEOPLASM, PROSTATE (ICD-V76.44) ANTIHYPERLIPIDEMIC USE, LONG TERM (ICD-V58.69) CARBUNCLE/FURUNCLE NOS (ICD-680.9) TOBACCO DEPENDENCE (ICD-305.1) HYPERTENSION, BENIGN SYSTEMIC (ICD-401.1) HYPERCHOLESTEROLEMIA (ICD-272.0) BACK PAIN, LOW (ICD-724.2) Cataracts  Past Surgical History: Last updated: 04/26/2009 Exercise Stress Test: low probability of signif CAD; achieved 12.3 METS - 10/12/1997 Flex sig 02/2000, clear to 45 cm. h/o cataract surgery x3, keep coming back (last 2009)  Social History: Last updated: 11/15/2009 Lives with wife, 4 grandchildren; Has three children. ; H/o 3 PPD smoker, down to 1/2 PPD (2011), interested in stopping smoking; Works on Veterinary surgeon.  No EtOH, no rec  drugs.  Risk Factors: Smoking Status: current (11/15/2009) Packs/Day: 0.5 (11/15/2009)  Social History: Lives with wife, 4 grandchildren; Has three children. ; H/o 3 PPD smoker, down to 1/2 PPD (2011), interested in stopping smoking; Works on Veterinary surgeon.  No EtOH, no rec drugs.  Review of Systems  The patient denies anorexia, fever, weight loss, hoarseness, chest pain, syncope, headaches, hemoptysis, abdominal pain, melena, severe  indigestion/heartburn, muscle weakness, suspicious skin lesions, difficulty walking, and unusual weight change.    Physical Exam  General:  VS noted, Well male in NAD Head:  Normocephalic and atraumatic without obvious abnormalities. No apparent alopecia or balding. Eyes:  h/o bilateral eye surgery for cataracts - implanted lens visible, erythema of conjunctiva which seems chronic in nature. Nose:  External nasal examination shows no deformity or inflammation. Nasal mucosa are pink and moist without lesions or exudates. Mouth:  Oral mucosa and oropharynx without lesions or exudates. Lungs:  Normal respiratory effort, chest expands symmetrically. Lungs are clear to auscultation, no crackles or wheezes. Heart:  Normal rate and regular rhythm. S1 and S2 normal without gallop, murmur, click, rub or other extra sounds. Abdomen:  Bowel sounds positive,abdomen soft and non-tender without masses, organomegaly or hernias noted. Msk:  Right Knee: No effusion, or erythema.  Non-tender over all joint lines.  Full ROM. Neg Lochmans, or McMurey's. No laxity.  Extremities:  no edema Cervical Nodes:  No lymphadenopathy noted Axillary Nodes:  No palpable lymphadenopathy   Impression & Recommendations:  Problem # 1:  HYPERTENSION, BENIGN SYSTEMIC (ICD-401.1) Assessment Deteriorated BP elevated not on medication. Plan to restart HCTZ today and f/u BP in 1-2 months. May need two medications.  Will also obtain BMP and LDL today.  His updated medication list for this problem includes:    Hydrochlorothiazide 25 Mg Tabs (Hydrochlorothiazide) ..... 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  No apparent minescal injury on my exam today.  Advise NSAIDs, RICE therapy.  If worsening will gove steroid injection.  If locking will refer to ortho. Pt voices understanding.  His updated medication list for this problem includes:    Adult Aspirin Low Strength 81 Mg Tbdp (Aspirin) .Marland Kitchen... Take 1 every day  Problem # 3:  TOBACCO DEPENDENCE (ICD-305.1) Assessment: Unchanged Discussed tobacco cessation for 15 mins.  Gave resources for quitline. Info on quit classes in area. Planned quit date. If needs medication will provide.  Orders: Provider Misc Charge- [facility] (Misc)  Problem # 4:  ERECTILE DYSFUNCTION (ICD-302.72) Assessment: Unchanged Change to levitra as is cheaper at [facility]. Will follow up next visit.  His updated medication list for this problem includes:    Levitra 10 Mg Tabs (Vardenafil hcl) .Marland Kitchen... 1 by mouth daily prn  Complete Medication List: 1)  Hydrochlorothiazide 25 Mg Tabs (Hydrochlorothiazide) .... One by mouth qd 2)  Adult Aspirin Low Strength 81 Mg Tbdp (Aspirin) .... Take 1 every day 3)  Simvastatin 40 Mg Tabs (Simvastatin) .... Take 1 every day 4)  Levitra 10 Mg Tabs (Vardenafil hcl) .Marland Kitchen.. 1 by mouth daily prn  Other Orders: Influenza Vaccine MCR (09381) Admin 1st Vaccine (82993) [facility]- Est Level  3 [phone])  Patient Instructions: 1)  Thank you for seeing me today. 2)  Please take your Blood Pressure medicine every day. 3)  You can take 2 aleve twice a day for knee pain. 4)  it if hurts or starts to swell up you can come back for a shot in your knee. 5)  Please come back in 1-2 months for a blood pressure check. 6)  Try to cut back to 5 ciagarettes a day. Prescriptions: LEVITRA 10 MG TABS (VARDENAFIL HCL) 1 by mouth daily prn  #1 x 11   Entered and Authorized by:   [name] MD   Signed by:   [name] MD on 11/15/2009   Method used:   Print then Give to Patient   RxID:    [id number] VIAGRA 50 MG TABS (SILDENAFIL CITRATE) use one pill as directed 30 min prior to relations  #10 x 1   Entered and Authorized by:   [name] MD   Signed by:   [name] MD on 11/15/2009   Method used:   [facility]        [facility] [phone]* (retail)       [address] [place]  [address]       Ph: [phone] or [phone]       Fax: [phone]   RxID:   [id number] SIMVASTATIN 40 MG  TABS (SIMVASTATIN) Take 1 every day  #32 x 6   Entered and Authorized by:   [name] MD   Signed by:   [name] MD on 11/15/2009   Method used:   [facility] to        [facility] [phone]* (retail)       [address]       [place], [place]  [address]       Ph: [phone] or [phone]       Fax: [phone]   RxID:   [id number] HYDROCHLOROTHIAZIDE 25 MG  TABS (HYDROCHLOROTHIAZIDE) one by mouth qd  #32 x 16   Entered and Authorized by:   [name] MD   Signed by:   [name] MD on 11/15/2009   Method used:   [facility] to        [facility] [phone]* (retail)       [address], [place]  [address]       Ph: [phone] or [phone]       Fax: [phone]   RxID:   [id number]    Prevention & Chronic Care Immunizations   Influenza vaccine: Fluvax MCR  (11/15/2009)   Influenza vaccine due: 10/12/2009    Tetanus booster: 06/11/2005: Done.   Tetanus booster due: 06/12/2015    Pneumococcal vaccine: Pneumovax  (03/29/2009)   Pneumococcal vaccine due: 03/29/2014    H. zoster vaccine: Not documented  Colorectal Screening   Hemoccult: Not documented   Hemoccult action/deferral: Ordered  (03/29/2009)    Colonoscopy: Not documented   Colonoscopy action/deferral: Refused  (03/29/2009)  Other Screening   PSA: 1.91  (03/29/2009)   Smoking status: current  (11/15/2009)   Smoking cessation counseling: yes  (04/25/2006)  Lipids   Total Cholesterol: 259  (03/29/2009)   LDL: 194  (03/29/2009)    LDL Direct: Not documented   HDL: 53  (03/29/2009)   Triglycerides: 61  (03/29/2009)    SGOT (AST): 21  (03/29/2009)  SGPT (ALT): 9  (03/29/2009)   Alkaline phosphatase: 99  (03/29/2009)   Total bilirubin: 1.2  (03/29/2009)    Lipid flowsheet reviewed?: Yes   Progress toward LDL goal: Unchanged  Hypertension   Last Blood Pressure: 162 / 84  (11/15/2009)   Serum creatinine: 0.97  (03/29/2009)   Serum potassium 4.6  (03/29/2009)    Hypertension flowsheet reviewed?: Yes   Progress toward BP goal: Deteriorated  Self-Management Support :   Personal Goals (by the next clinic visit) :      Personal blood pressure goal: 140/90  (03/29/2009)     Personal LDL goal: 130  (03/29/2009)    Patient will work on the following items until the next clinic visit to reach self-care goals:     Medications and monitoring: take my medicines every day, check my blood pressure, weigh myself weekly  (11/15/2009)    Hypertension self-management support: Not documented    Lipid self-management support: Lipid monitoring log, Written self-care plan, Education handout  (03/29/2009)    Nursing Instructions: Give Flu vaccine today    Immunizations Administered:  Influenza Vaccine # 1:    Vaccine Type: Fluvax MCR    Site: left deltoid    Mfr: GlaxoSmithKline    Dose: 0.5 ml    Route: IM    Given by: Jone Baseman CMA    Exp. Date: 08/08/2010    Lot #: [id number]    VIS given: 09/05/09 version given November 15, 2009.  Flu Vaccine Consent Questions:    Do you have a history of severe allergic reactions to this vaccine? no    Any prior history of allergic reactions to egg and/or gelatin? no    Do you have a sensitivity to the preservative Thimersol? no    Do you have a past history of Guillan-Barre Syndrome? no    Do you currently have an acute febrile illness? no    Have you ever had a severe reaction to latex? no    Vaccine information given and explained to patient? yes

## 2010-03-13 NOTE — Assessment & Plan Note (Signed)
Summary: Admit H&P   Vital Signs:  Patient profile:   60 year old male O2 Sat:      99 % on Room air Temp:     97.7 degrees F oral Pulse rate:   77 / minute Pulse rhythm:   regular Resp:     16 per minute BP sitting:   135 / 78  (left arm)  O2 Flow:  Room air  Primary Care Provider:  [name] MD   History of Present Illness: [name] presents to clinic today with chest pain, located substernally worse with exertion and non radiating for the last week. The pain started as intermintent and felt like gas pain. It was releived by tums. However yesterday and today the pain worsened and became worse with exertion. He went to work today and his pain was worsened by shoveling.  His pain lessened with rest. In the ED his pain resolved with SL nitroglycerin.  He denies any fever, chills, cough, dyspnea, or dizzyness.   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 One By Mouth Qd 2)  Adult Aspirin Low Strength 81 Mg  Tbdp (Aspirin) .... Take 1 Every Day 3)  Levitra 10 Mg Tabs (Vardenafil Hcl) .Marland Kitchen.. 1 By Mouth Daily Prn 4)  Lipitor 40 Mg Tabs  (Atorvastatin Calcium) .Marland Kitchen.. 1 By Mouth Qhs  Allergies (verified): No Known Drug Allergies  Past History:  Past Medical History: Last updated: 04/26/2009 Current Problems:  SCREENING FOR MALIGNANT NEOPLASM, PROSTATE (ICD-V76.44) ANTIHYPERLIPIDEMIC USE, LONG TERM (ICD-V58.69) CARBUNCLE/FURUNCLE NOS (ICD-680.9) TOBACCO DEPENDENCE (ICD-305.1) HYPERTENSION, BENIGN SYSTEMIC (ICD-401.1) HYPERCHOLESTEROLEMIA (ICD-272.0) BACK PAIN, LOW (ICD-724.2) Cataracts  Family History: Last updated: 04/11/09 Father died of prostate, colon, lung CA passed when 3s. Also HTN. Mother died of heart disease (smoker), IDDM, CAD, HTN, PGM--unknown cancer  Social History: Last updated: 11/15/2009 Lives with wife, 4 grandchildren; Has three children. ; H/o 3 PPD smoker, down to 1/2 PPD (2011), interested in stopping smoking; Works on Veterinary surgeon.  No EtOH, no rec drugs.  Risk Factors: Smoking Status: current (12/21/2009) Packs/Day: 0.5 (12/21/2009)  Review of Systems       The patient complains of chest pain and dyspnea on exertion.  The patient denies anorexia, fever, weight loss, decreased hearing, hoarseness, syncope, headaches, abdominal pain, severe indigestion/heartburn, muscle weakness, difficulty walking, and abnormal bleeding.    Physical Exam  General:  VS noted.  Tired appearing male in NAD Head:  Normocephalic and atraumatic without obvious abnormalities. No apparent alopecia or balding. Eyes:  h/o bilateral eye surgery for cataracts - implanted lens visible, erythema of conjunctiva which seems chronic in nature. Ears:  no external deformities.  Nose:  no external deformity.   Mouth:  Oral mucosa and oropharynx without lesions or exudates. Lungs:  Normal respiratory effort, chest expands symmetrically. Lungs are clear to auscultation, no crackles or wheezes. Heart:  Normal rate and regular rhythm. S1 and S2 normal without gallop, murmur, click, rub or other extra  sounds. Abdomen:  Bowel sounds positive,abdomen soft and non-tender without masses, organomegaly or hernias noted. Extremities:  no edema Neurologic:  alert & oriented X3, cranial nerves II-XII intact, strength normal in all extremities, and sensation intact to light touch.   Cervical Nodes:  No lymphadenopathy noted Axillary Nodes:  No palpable lymphadenopathy Additional Exam:  Labs: 137/4.4 101/31 6/0.9<90 CXR: No acute findings.  POC CE: <0.05 EKG: Rate 81 Sinus regular. QRSd 80 QC 429. No ST changes   Impression & Recommendations:  Problem # 1:  CHEST PAIN, ACUTE (ICD-786.50) Assessment [place] for cardiac etiology based on age, male, smoker, LDL 200, and chest pain that is releived by nitroglycerin and rest.  Plan: Cycle cardiac enzymes and follow on tellemetry. EKG, fasting lipids and HbA1c and TSH in AM.  Will provide nitroglycerine if needed.  If pain becomes persistant will start heparin and call cardiology.  Problem # 2:  GERD (ICD-530.81) Could be source of chest pain.  Will provide protonix in  house and follow.   Problem # 3:  TOBACCO DEPENDENCE (ICD-305.1) Major risk factor for chest pain. Will obtain tobacco cessation consueling.   Problem # 4:  HYPERCHOLESTEROLEMIA (ICD-272.0) Will continue home liptor and check fasting lipids.  His updated medication list for this problem includes:    Lipitor 40 Mg Tabs (Atorvastatin calcium) .Marland Kitchen... 1 by mouth qhs  Problem # 5:  FEN GI Will keep NPO and provide IVF  Problem # 6:  Code Full code  Problem # 7:  PPX Heparin and protonix  Problem # 8:  Dispo When work up complete  Complete Medication List: 1)  Hydrochlorothiazide 25 Mg Tabs (Hydrochlorothiazide) .... One by mouth qd 2)  Adult Aspirin Low Strength 81 Mg Tbdp (Aspirin) .... Take 1 every day 3)  Levitra 10 Mg Tabs (Vardenafil hcl) .Marland Kitchen.. 1 by mouth daily prn 4)  Lipitor 40 Mg Tabs (Atorvastatin calcium) .Marland Kitchen.. 1 by mouth qhs

## 2010-03-15 NOTE — Assessment & Plan Note (Signed)
Summary: 11:30APPT,ETT PER [facility]   Vital Signs:  Patient profile:   61 year old male BP sitting:   135 / 80  Primary Care Provider:  [name] MD   History of Present Illness: Here for ETT symptoms 2 weeks ago were initially chest pain non exertional, partially relieved with tmns and gas-x. THen he had some pain with exertion and was admitted overnight for rule-out. Workup was negative and is here for ETT today. Has not had similar pain since discharge. Works in [employer] representative.  Exercise Tolerance Test Cardiovascular Risk History:      Positive major cardiovascular risk factors include male age over 78 years old, hyperlipidemia, and hypertension.  Negative major cardiovascular risk factors include no history of diabetes and non-tobacco-user status.        Further assessment for target organ damage reveals no history of ASHD, cardiac end-organ damage (CHF/LVH), stroke/TIA, peripheral vascular disease, renal insufficiency, or hypertensive retinopathy.    Baseline EKG:    Rhythm:     normal sinus    Rate:       88    Abn. ST segments:   no    Pathologic Q-waves:   no  Exercise Tolerance Test Results:    Stress Modality:     exercise-treadmill    Protocol:       Standard Bruce-maximal    MPHR (bpm):        159    85% MPHR (bpm):     135    Reached 85% MPHR       (min:sec):       142    Workload in METS:     10.1    ST Segment analysis:       At Rest:       normal ST segments-no evidence of significant ST depression       With Exercise:     no evidence of significant ST depression    Arrhythmia:             no    Angina during ETT:     absent (0)  Allergies: No Known Drug Allergies  Past History:  Past Medical History: Last updated: 04/26/2009 Current Problems:  SCREENING FOR MALIGNANT NEOPLASM, PROSTATE (ICD-V76.44) ANTIHYPERLIPIDEMIC USE, LONG TERM (ICD-V58.69) CARBUNCLE/FURUNCLE NOS (ICD-680.9) TOBACCO DEPENDENCE (ICD-305.1) HYPERTENSION, BENIGN SYSTEMIC  (ICD-401.1) HYPERCHOLESTEROLEMIA (ICD-272.0) BACK PAIN, LOW (ICD-724.2) Cataracts  Past Surgical History: Last updated: 04/26/2009 Exercise Stress Test: low probability of signif CAD; achieved 12.3 METS - 10/12/1997 Flex sig 02/2000, clear to 45 cm. h/o cataract surgery x3, keep coming back (last 2009)  Family History: Last updated: April 06, 2009 Father died of prostate, colon, lung CA passed when 2s. Also HTN. Mother died of heart disease (smoker), IDDM, CAD, HTN, PGM--unknown cancer  Social History: Last updated: 12/28/2009 Lives with wife, 4 grandchildren; Has three children. ; H/o 3 PPD smoker, quit (Nov, 2011), interested in stopping smoking; Works on Veterinary surgeon.  No EtOH, no rec drugs.   Impression & Recommendations:  Problem # 1:  CHEST PAIN (ICD-786.50)  Orders: ETT w/ interpretation and report (84696) Negative ETT  He has follow up with Dr Denyse Amass Notable he has NOT SMOKES in 2 weeks, does not plan to restart. His wife has quit as well.  Complete Medication List: 1)  Hydrochlorothiazide 25 Mg Tabs (Hydrochlorothiazide) .... One by mouth qd 2)  Adult Aspirin Low Strength 81 Mg Tbdp (Aspirin) .... Take 1 every day 3)  Lipitor 80 Mg Tabs (Atorvastatin calcium) .Marland KitchenMarland KitchenMarland Kitchen  1 by mouth qhs 4)  Polyethylene Glycol 3350 Powd (Polyethylene glycol 3350) .Marland Kitchen.. 1 cap 1-2 time daily as needed for constipation. disp 1 large bottle 5)  Nitrostat 0.4 Mg Subl (Nitroglycerin) .Marland Kitchen.. 1 sl q2min x3 as needed chest pain. disp 1 tube ETT w/ interpretation and report ([id number])  Cardiovascular Risk Assessment/Plan:      The patient's hypertensive risk group is category B: At least one risk factor (excluding diabetes) with no target organ damage.  His calculated 10 year risk of coronary heart disease is 14 %.  Today's blood pressure is 135/80.    Exercise Tolerance Test Assessment:    Quality of ETT:   diagnostic    ETT Interpretation:   normal-no evidence of ischemia by ST analysis     Recommendations:   Good effort, asymptomatic with no EKG changes conserning for ischemia. Reached and exceeded THR Please see hospital report for complete details. NEGATIVE ETT     Orders Added: 1)  ETT w/ interpretation and report [[id number]]       Impression & Recommendations:  Orders: ETT w/ interpretation and report ([id number])   Complete Medication List: 1)  Hydrochlorothiazide 25 Mg Tabs (Hydrochlorothiazide) .... One by mouth qd 2)  Adult Aspirin Low Strength 81 Mg Tbdp (Aspirin) .... Take 1 every day 3)  Lipitor 80 Mg Tabs (Atorvastatin calcium) .Marland Kitchen.. 1 by mouth qhs 4)  Polyethylene Glycol 3350 Powd (Polyethylene glycol 3350) .Marland Kitchen.. 1 cap 1-2 time daily as needed for constipation. disp 1 large bottle 5)  Nitrostat 0.4 Mg Subl (Nitroglycerin) .Marland Kitchen.. 1 sl q51min x3 as needed chest pain. disp 1 tube  Cardiovascular Risk Assessment/Plan:      The patient's hypertensive risk group is category B: At least one risk factor (excluding diabetes) with no target organ damage.  His calculated 10 year risk of coronary heart disease is 14 %.  Today's blood pressure is 135/80.    Exercise Tolerance Test Assessment:    Quality of ETT:   diagnostic    ETT Interpretation:   normal-no evidence of ischemia by ST analysis    Recommendations:   Good effort, asymptomatic with no EKG changes conserning for ischemia. Reached and exceeded THR Please see hospital report for complete details. NEGATIVE ETT

## 2010-03-15 NOTE — Miscellaneous (Signed)
   Clinical Lists Changes  Problems: Removed problem of CHEST PAIN (ICD-786.50) Removed problem of ROUTINE GENERAL MEDICAL EXAM@[facility] (ICD-V70.0) Removed problem of SPECIAL SCREENING FOR MALIGNANT NEOPLASMS COLON (ICD-V76.51) Removed problem of Hospitalized for  CHEST PAIN, ATYPICAL (ICD-786.59) Removed problem of SCREENING FOR MALIGNANT NEOPLASM, PROSTATE (ICD-V76.44)

## 2010-04-24 LAB — POCT I-STAT, CHEM 8
BUN: 6 mg/dL (ref 6–23)
Calcium, Ion: 1.17 mmol/L (ref 1.12–1.32)
Chloride: 101 mEq/L (ref 96–112)
Glucose, Bld: 90 mg/dL (ref 70–99)
TCO2: 31 mmol/L (ref 0–100)

## 2010-04-24 LAB — CARDIAC PANEL(CRET KIN+CKTOT+MB+TROPI)
CK, MB: 1.6 ng/mL (ref 0.3–4.0)
Relative Index: 1.1 (ref 0.0–2.5)
Relative Index: 1.1 (ref 0.0–2.5)
Total CK: 152 U/L (ref 7–232)
Troponin I: 0.01 ng/mL (ref 0.00–0.06)
Troponin I: 0.03 ng/mL (ref 0.00–0.06)

## 2010-04-24 LAB — POCT CARDIAC MARKERS
CKMB, poc: <1 ng/mL — ABNORMAL LOW (ref 1.0–8.0)
Troponin i, poc: <0.05 ng/mL (ref 0.00–0.09)

## 2010-04-24 LAB — TROPONIN I: Troponin I: 0.01 ng/mL (ref 0.00–0.06)

## 2010-04-24 LAB — LIPID PANEL
HDL: 46 mg/dL (ref >39)
VLDL: 21 mg/dL (ref 0–40)

## 2010-04-24 LAB — TSH: TSH: 2.301 u[IU]/mL (ref 0.350–4.500)

## 2010-05-23 ENCOUNTER — Other Ambulatory Visit: Payer: Self-pay | Admitting: Family Medicine

## 2010-05-23 ENCOUNTER — Encounter: Payer: Self-pay | Admitting: Family Medicine

## 2010-05-23 DIAGNOSIS — F528 Other sexual dysfunction not due to a substance or known physiological condition: Secondary | ICD-10-CM

## 2010-05-23 NOTE — Progress Notes (Signed)
Refill request for viagra.  He is also on nitroglycerin PRN. I cannot refill due to drug reactions.  I tried to call but was unable to reach [name]. Will send letter.   [name]

## 2010-06-10 ENCOUNTER — Inpatient Hospital Stay (INDEPENDENT_AMBULATORY_CARE_PROVIDER_SITE_OTHER)
Admission: RE | Admit: 2010-06-10 | Discharge: 2010-06-10 | Disposition: A | Discharge status: Home / Self Care | Payer: BC Managed Care – PPO | Source: Ambulatory Visit | Attending: Family Medicine | Admitting: Family Medicine

## 2010-06-10 DIAGNOSIS — S0100XA Unspecified open wound of scalp, initial encounter: Secondary | ICD-10-CM

## 2010-06-17 ENCOUNTER — Inpatient Hospital Stay (HOSPITAL_COMMUNITY)
Admission: RE | Admit: 2010-06-17 | Discharge: 2010-06-17 | Disposition: A | Discharge status: Home / Self Care | Payer: BC Managed Care – PPO | Source: Ambulatory Visit | Attending: Family Medicine | Admitting: Family Medicine

## 2010-06-26 NOTE — Discharge Summary (Signed)
[name], [name]             ACCOUNT NO.:  [id number]   MEDICAL RECORD NO.:  [id number]          PATIENT TYPE:  INP   LOCATION:  3705                         FACILITY:  [facility]   PHYSICIAN:  [name], MD      DATE OF BIRTH:  1951/01/02   DATE OF ADMISSION:  10/12/2006  DATE OF DISCHARGE:  10/13/2006                               DISCHARGE SUMMARY   REASON FOR ADMISSION:  Chest pain, rule out.   COURSE AND TREATMENT RENDERED:  [name] is a 60 year old African-  American male who presented with chest pain.  It was decided to give a  full rule out of unstable angina versus musculoskeletal versus GERD as  causes for his chest pain.  This was done particularly because he has a  history of smoking as well as hyperlipidemia and hypertension.  He was  started on an aspirin 325 mg p.o. q.day as well as Protonix 40 mg p.o.  b.i.d. as well as metoprolol 25 mg p.o. q.day.  He was also started on  ibuprofen 800 mg p.o. t.i.d. in the case this was musculoskeletal in  nature.  The Protonix was in case this was related to GERD.  After a  short time on the beta-blocker, he was found to be bradycardic into the  50s; thus, the beta-blocker was discontinued.  His HCTZ had initially  been discontinued in favor of the beta-blocker, but after  discontinuation of the beta-blocker, the HCTZ was restarted at 25 mg  p.o. q.day.  It was felt that PE was unlikely given lack of shortness of  breath and nature of chest pain.  It was also felt that cardiac cause  was unlikely as well, however cardiac enzymes x3 were drawn all of which  were negative.   PERTINENT LABORATORY DATA:  Initial cardiac panel shows total CK 129, CK-  MB 1.5, troponin I 0.01, repeat eight hours later shows total CK 103, CK-  MB 1.3, troponin I 0.01, and final cardiac enzymes show total creatinine  kinase 116, CK-MB 1.4, troponin I 0.01.   OTHER PERTINENT LAB:  Hemoglobin A1c 6.0, TSH 2.345.  Lipid profile:  Cholesterol 244,  triglycerides 145, HDL cholesterol 34, LDL cholesterol  181, VLDL cholesterol 29.  Comprehensive metabolic panel shows sodium  138, potassium 4.0, chloride 105, bicarb 26, glucose 102, BUN 12,  creatinine 1.01.  Total bilirubin 1.1, alkaline phosphatase 78, AST 12,  ALT 14, total protein 6.2, albumin 3.3, calcium 9.1 and CBC shows a  white count 6.5, hemoglobin and hematocrit 16.2 and 48, respectively,  and platelet count 283.   FINAL DIAGNOSES:  1. Chest pain, likely related to viral pleurisy.  2. Hyperlipidemia.  3. Hypertension.  4. Tobacco abuse.   CONDITION ON DISCHARGE:  Well.   DISCHARGE MEDICATIONS:  1. Aspirin 81 mg p.o. q.day.  2. Ibuprofen 600 mg p.o. q.6 hours p.r.n. pain.  3. Maalox 30 mL p.o. q.6 hours p.r.n.  4. Simvastatin 40 mg p.o. q.day; please note that this was started      instead of Lipitor for financial consideration.  5. Prilosec OTC 20 mg p.o.  q.day.  6. HCTZ 25 mg p.o. q.day.   INSTRUCTIONS:  1. Please follow up this week with Dr. Luz Brazen, your PCP, phone number      [phone].  2. Please also continue a low sodium, heart-healthy diet.  3. Also recommend that you quit smoking.  4. Would recommend followup as an outpatient with cardiology for      complete workup and would consider stress test in the future given      risk factors of hypertension, hyperlipidemia and smoking.      [name], MD  Electronically Signed     SA/MEDQ  D:  10/13/2006  T:  10/13/2006  Job:  [id number]

## 2010-06-26 NOTE — H&P (Signed)
NAME:  [name], [name]             ACCOUNT NO.:  [id number]   MEDICAL RECORD NO.:  [id number]          PATIENT TYPE:  INP   LOCATION:  3705                         FACILITY:  [facility]   PHYSICIAN:  Santiago Bumpers. Hensel, M.D.DATE OF BIRTH:  1950-05-25   DATE OF ADMISSION:  10/12/2006  DATE OF DISCHARGE:                              HISTORY & PHYSICAL   CHIEF COMPLAINT:  Chest pain.   HISTORY OF PRESENT ILLNESS:  The patient is a 60 year old African  American male who presents with acute and sudden chest pressure at 7:00  a.m. this morning awakening him from sleep.  The patient had a feeling  of someone sitting on his chest as well as sharp pains on the left  lateral chest wall.  The patient said it hurt when took a deep breath.  To patient took two baby aspirin and the episode self-resolved.  The  patient states it reoccurred at rest when he was moving to the den.  After he had moved to the den, it suddenly occurred and lasted for about  10 seconds but it was severe and said that he got very short of breath.  He never had nauseous, no numbness or tingling, no weakness down the  left arm or right arm but just had difficulty breathing during the chest  pain due to the severity.  The patient states he had one time prior with  something similar but it was relieved by Tums.  He is a smoker.  He  smokes one and a half packs per day.  He was previously a three-pack-per-  day smoker.  He is interested in quitting now.  He does have a history  of hyperlipidemia, but no history of MI.  His risk factors include he is  shortness of breath at baseline with normal activity.  Positive tobacco.  No history of MI in a male less than 29 or male less than 65.  He is  on antihypertensive meds.   REVIEW OF SYSTEMS:  CONSTITUTIONAL:  No fever or chills.  CARDIOVASCULAR:  He does have chest pain but not current actively.  PULMONARY:  He has a chronic cough due to smoking.  GI: He has had no  nausea, vomiting  or diarrhea.  GU: No dysuria.   PAST MEDICAL HISTORY:  1. Hypercholesterolemia.  2. Smoking tobacco abuse.  3. Hypertension.  4. History of low back pain.  5. History of sexual dysfunction.  6. He also has a history of genital herpes __________  which was      treated in 1996.   SURGICAL HISTORY:  He has never had any surgeries but he did have an SI  stress test with a low probability of significant coronary artery  disease back in 1999.   FAMILY HISTORY:  Father died of prostate cancer and hypertension.  Mother died of lung cancer.  She did have insulin dependent diabetes  mellitus.  She had a heart attack at age 46.   SOCIAL HISTORY:  Lives with wife and four grandchildren.  He has three  children of his own.  He is an active smoker of  one and a half packs per  day.  He wants to stop smoking.  He does not use alcohol or drugs.  He  works on a Veterinary surgeon.   PHYSICAL EXAMINATION:  VITAL SIGNS:  Temperature 97.1, pulse 59, rate  20.  Blood pressure 123/81, sating 96% on room air.  GENERAL APPEARANCE:  He is in no acute distress, pleasant appearing,  very alert and with it, able to converse.  Mental status is at baseline.  HEENT:  Normocephalic, atraumatic.  Extraocular muscles are intact.  Moist mucous membranes.  He is status post surgery for cataracts  bilaterally.  CHEST:  We are able to reproduce the pain with palpation located at the  left lateral chest wall along the intercostal muscles.  HEART:  Regular  rate and rhythm.  No murmurs, rubs or gallops.  LUNGS:  Clear to auscultation bilaterally.  ABDOMEN:  Soft, nontender, nondistended with positive bowel sounds.  EXTREMITIES:  There is no edema.  Full range of motion.  Pulses are +2  bilaterally in dorsalis pedis.  Cranial nerves II-XII are intact.  His  balance and gait was not assessed at this time.   LABORATORY DATA:  Point of care markers in the ED were negative x2 with  a CK-MB of less than 1, troponin  less than 0.05 and myoglobin 24 and 43.  Creatinine was noted to be 0.87, potassium 4.1, sodium 140, calcium 9.2.  Hemoglobin 16.9, white count of 6, platelets of 290.   Chest x-ray shows heart size normal with no active disease.   ASSESSMENT/PLAN:  This is a 60 year old with new onset chest pain at  rest, positive tobacco, history of hypertension on antihypertensive  medications.  1. Chest pain.  Due to the severity and the acute onset of this pain,      we must rule out for an myocardial infarction in the setting of a      new onset chest pain at rest, concern for unstable angina with      these risk factors of smoking and hypertension.  However, there are      other etiologies of pruritus which are also possible and more      likely at this time.  The patient is not currently actively having      chest pain.  We will check cardiac enzyme panels for q.8h. x3, will      place him on a beta blocker.  Will choose metoprolol since he is a      smoker, will started him at 25 mg p.o. b.i.d.; however, will need      to monitor his blood pressure and his pulse because currently his      pulse is at 59 and is not on any beta blocking agents.  We will      start him on an aspirin at 325.  We will check a TSH, fasting lipid      profile and hemoglobin A1c.  We will hold his hydrochlorothiazide      at this time due to the fact that we will be starting metoprolol.  2. Hypertension. We are going to discontinue his hydrochlorothiazide.      We will start the beta blocker at 25 mg p.o. b.i.d.  Will need to      consider watching his blood pressure and his pulse and we may need      to lower this medication.  Also consider an ACE inhibitor as an  outpatient.  3. Tobacco abuse.  We will consult smoking cessation.  4. Gastrointestinal prophylaxis.  He is going to be on Protonix.  5. Deep venous thrombosis prophylaxis.  He will be on sequential      compression devices.  6. Hyperlipidemia.  Will  check a fasting lipid profile.  He is not      currently taking Lipitor at this time.  We will just check levels      and consider starting a $4 generic plan.   DISPOSITION:  Will await results from his cardiac enzymes and resolution  of his pain symptoms which he is currently at.  I would definitely  consider a Myoview stress test either inpatient or outpatient due to the  fact that this  patient has significant risk factors and currently today had chest pain.  This can be decided by the primary team in the a.m. or cardiology  whichever they deem.  We will also try some ibuprofen 800 mg t.i.d. to  help with pleuritis type pain and the Protonix to help __________  for  other causes such as reflux.      Wilhemina Bonito, M.D.  Electronically Signed      Santiago Bumpers. Leveda Anna, M.D.  Electronically Signed    ML/MEDQ  D:  10/13/2006  T:  10/13/2006  Job:  [id number]

## 2010-06-26 NOTE — Discharge Summary (Signed)
NAMETHEODORE, VIRGIN             ACCOUNT NO.:  [id number]   MEDICAL RECORD NO.:  [id number]          PATIENT TYPE:  INP   LOCATION:  3705                         FACILITY:  [facility]   PHYSICIAN:  [name], MD      DATE OF BIRTH:  Feb 29, 1950   DATE OF ADMISSION:  10/12/2006  DATE OF DISCHARGE:  10/13/2006                               DISCHARGE SUMMARY   REASON FOR ADMISSION:  Rule out chest pain.   COURSE AND TREATMENT RENDERED:  Mr. Googe is a 60 year old African-  American male who presented with chest pain.  He was admitted for workup  of unstable angina versus musculoskeletal versus GERD.  Cardiac enzymes  x3 were drawn all of which were negative.  He was started on Protonix 40  mg p.o. b.i.d. and ibuprofen 800 mg t.i.d. as well as aspirin 325 mg  p.o. q.day.  His chest pain resolved.   CONSULTATIONS/INFECTIONS:  None.   FINAL DIAGNOSES:  1. Chest pain secondary to viral pleurisy.  2. Hyperlipidemia.  3. Hypertension.   Dictation ended at this point.      [name], MD     SA/MEDQ  D:  10/13/2006  T:  10/13/2006  Job:  [id number]

## 2010-06-27 ENCOUNTER — Encounter: Payer: Self-pay | Admitting: Family Medicine

## 2010-06-27 ENCOUNTER — Ambulatory Visit (INDEPENDENT_AMBULATORY_CARE_PROVIDER_SITE_OTHER): Payer: BC Managed Care – PPO | Admitting: Family Medicine

## 2010-06-27 VITALS — BP 137/80 | HR 82 | Temp 98.3°F | Ht 69.75 in | Wt 178.0 lb

## 2010-06-27 DIAGNOSIS — E7800 Pure hypercholesterolemia, unspecified: Secondary | ICD-10-CM

## 2010-06-27 DIAGNOSIS — I10 Essential (primary) hypertension: Secondary | ICD-10-CM

## 2010-06-27 DIAGNOSIS — Z125 Encounter for screening for malignant neoplasm of prostate: Secondary | ICD-10-CM

## 2010-06-27 DIAGNOSIS — F528 Other sexual dysfunction not due to a substance or known physiological condition: Secondary | ICD-10-CM

## 2010-06-27 LAB — LDL CHOLESTEROL, DIRECT: Direct LDL: 120 mg/dL — ABNORMAL HIGH

## 2010-06-27 MED ORDER — VARDENAFIL HCL 10 MG PO TABS: 10.0000 mg | ORAL_TABLET | ORAL | Status: DC | PRN

## 2010-06-27 NOTE — Assessment & Plan Note (Signed)
Discussed risks vs benefits of PSA screening. Agrees. Will test PSA today.

## 2010-06-27 NOTE — Assessment & Plan Note (Signed)
Doing well at goal. No changes planned. BMP today.

## 2010-06-27 NOTE — Patient Instructions (Signed)
Thank you for coming in today. Take the miralax every other or every 3rd day as needed to get 1 poop a day.  Take the levitra as needed. But let everyone know before they give you chest pain medicine.  There is some risk to this medicine.  I will send you a letter about your labs.  Come back in 6 months or sooner if not feeling well.

## 2010-06-27 NOTE — Assessment & Plan Note (Signed)
On max dose of lipitor. Will check direct LDL today.

## 2010-06-27 NOTE — Progress Notes (Signed)
Mr Nearhood presents to the clinic today to follow up: 1) Erectile Dysfunction: has taken Viagra in the past and levitra in the past. Ran out recently of his ED medications. He had a rx for nitroglycerin which he has never filled no ever taken. He denies any further chest pain episodes. Request refill of levitra. Understands the risks of this medication.   2) Constipation: Relieved with miralax. Having multiple BMs a day. Has not titrated his Miralax down from daily. Feeling well. No blood in his stool.   3) HTN: Doing well taking his medication. Home BP are mostly 130s some as high as 150s. No chest pain, palpitations, or dyspnea. No leg swelling.   4) HLD: Takes atorvistatin 80 day. Feels well.   5) Smoking: Quit 6 months ago. Feels well. Occasional cravings.   PMH reviewed.  ROS as above otherwise neg  Exam:  Vs noted.  Gen: Well NAD HEENT: EOMI , MMM Lungs: CTABL Nl WOB Heart: RRR no MRG Abd: NABS, NT, ND Exts: Non edematous BL  LE

## 2010-06-27 NOTE — Assessment & Plan Note (Signed)
Ran out of viagra. Requests levitra.  Never has taken nitroglycerin. Never filled the NTG. Spent 5 mins discussing the risks vs benefits of levitra while having cardiac risk factors. He understands and accepts these risks. I agree that the quality of life improvement of being able to have sex is worth the small risk to this patient.  Will follow.

## 2010-06-28 LAB — HEPATIC FUNCTION PANEL
ALT: 13 U/L (ref 0–53)
AST: 14 U/L (ref 0–37)
Albumin: 4.3 g/dL (ref 3.5–5.2)
Bilirubin, Direct: 0.3 mg/dL (ref 0.0–0.3)
Total Protein: 6.9 g/dL (ref 6.0–8.3)

## 2010-06-28 LAB — BASIC METABOLIC PANEL
Calcium: 10.4 mg/dL (ref 8.4–10.5)
Creat: 0.85 mg/dL (ref 0.40–1.50)
Glucose, Bld: 86 mg/dL (ref 70–99)
Sodium: 140 mEq/L (ref 135–145)

## 2010-07-05 ENCOUNTER — Encounter: Payer: Self-pay | Admitting: Family Medicine

## 2010-11-23 LAB — BASIC METABOLIC PANEL
CO2: 25
CO2: 26
Calcium: 8.9
Calcium: 9.2
Creatinine, Ser: 1.05
Creatinine, Ser: 0.89
GFR calc Af Amer: >60
GFR calc Af Amer: >60
GFR calc non Af Amer: >60
GFR calc non Af Amer: >60
Glucose, Bld: 96
Sodium: 140
Sodium: 140

## 2010-11-23 LAB — CARDIAC PANEL(CRET KIN+CKTOT+MB+TROPI)
CK, MB: 1.3
CK, MB: 1.5
Relative Index: 1.2
Relative Index: 1.2
Total CK: 103
Total CK: 116
Total CK: 129
Troponin I: 0.01
Troponin I: 0.01

## 2010-11-23 LAB — DIFFERENTIAL
Basophils Absolute: 0.1
Basophils Relative: 1
Lymphocytes Relative: 46
Neutro Abs: 2.6
Neutrophils Relative %: 43

## 2010-11-23 LAB — CBC
MCHC: 33.7
MCHC: 34.2
MCV: 87.3
Platelets: 283
Platelets: 290
RDW: 14.7 — ABNORMAL HIGH
RDW: 14.7 — ABNORMAL HIGH

## 2010-11-23 LAB — COMPREHENSIVE METABOLIC PANEL
AST: 13
Albumin: 3.3 — ABNORMAL LOW
BUN: 12
Calcium: 9.1
Chloride: 105
Creatinine, Ser: 1.01
GFR calc Af Amer: >60
GFR calc non Af Amer: >60
Total Bilirubin: 1.1

## 2010-11-23 LAB — LIPID PANEL
Triglycerides: 145
VLDL: 29

## 2010-11-23 LAB — POCT CARDIAC MARKERS
CKMB, poc: <1 — ABNORMAL LOW
CKMB, poc: <1 — ABNORMAL LOW
Myoglobin, poc: 43.2
Operator id: 4534
Troponin i, poc: <0.05

## 2010-11-23 LAB — HEMOGLOBIN A1C
Hgb A1c MFr Bld: 6
Mean Plasma Glucose: 136

## 2011-01-11 ENCOUNTER — Ambulatory Visit: Payer: BC Managed Care – PPO | Admitting: Family Medicine

## 2011-01-22 ENCOUNTER — Encounter: Payer: Self-pay | Admitting: Family Medicine

## 2011-01-22 ENCOUNTER — Ambulatory Visit (INDEPENDENT_AMBULATORY_CARE_PROVIDER_SITE_OTHER): Payer: Self-pay | Admitting: Family Medicine

## 2011-01-22 VITALS — BP 163/80 | HR 64 | Temp 97.7°F | Ht 69.75 in | Wt 181.0 lb

## 2011-01-22 DIAGNOSIS — I10 Essential (primary) hypertension: Secondary | ICD-10-CM

## 2011-01-22 DIAGNOSIS — Z23 Encounter for immunization: Secondary | ICD-10-CM

## 2011-01-22 DIAGNOSIS — E7800 Pure hypercholesterolemia, unspecified: Secondary | ICD-10-CM

## 2011-01-22 DIAGNOSIS — F528 Other sexual dysfunction not due to a substance or known physiological condition: Secondary | ICD-10-CM

## 2011-01-22 MED ORDER — VARDENAFIL HCL 10 MG PO TABS: 10.0000 mg | ORAL_TABLET | ORAL | Status: DC | PRN

## 2011-01-22 NOTE — Assessment & Plan Note (Signed)
Will refill Levitra today.

## 2011-01-22 NOTE — Assessment & Plan Note (Signed)
LDL at 120 last visit. Her on Lipitor 80 mg. No plans to change

## 2011-01-22 NOTE — Patient Instructions (Signed)
Thank you for coming in today. I refilled your Levitra.  Check your blood pressure at the pharmacy 1x a week.  Let me know if it usually above 145.  Try to arrange for a doctor in [place].  Flu shot today.  Quit Smoking.

## 2011-01-22 NOTE — Progress Notes (Signed)
Mr. [name] is a 60 year old male presented to clinic today to followup his hypertension, hyperlipidemia and receive a flu shot.  #1 hypertension: Blood pressure is elevated today however he did not take his hydrochlorothiazide yet this morning. He typically takes his blood pressure medication daily. He also measures [facility] blood [facility] at [facility] and usually it is less than 140 systolic. He denies any chest pain dyspnea palpitations or significant edema.  #2 Hyperlipidemia: Currently taking Lipitor 80 doing well. No chest pains or palpitations.  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 Previously have discussed the risks of this medication and patient agrees to them.  PMH reviewed.  ROS as above otherwise neg Medications reviewed. Current Outpatient Prescriptions  Medication Sig Dispense Refill  . aspirin 81 MG tablet Take 81 mg by mouth daily.        Marland Kitchen atorvastatin (LIPITOR) 80 MG tablet Take 80 mg by mouth at bedtime.        . hydrochlorothiazide 25 MG tablet Take 25 mg by mouth daily.        . Polyethylene Glycol 3350 POWD 1 cap 1-2 times daily as needed for constipation.  Disp 1 large bottle.       . vardenafil (LEVITRA) 10 MG tablet Take 1 tablet (10 mg total) by mouth as needed for erectile dysfunction.  20 tablet  1    Exam:  BP 163/80  Pulse 64  Temp(Src) 97.7 F (36.5 C) (Oral)  Ht 5' 9.75" (1.772 m)  Wt 181 lb (82.101 kg)  BMI 26.16 kg/m2 Gen: Well NAD HEENT: EOMI,  MMM Lungs: CTABL Nl WOB Heart: RRR no MRG Abd: NABS, NT, ND Exts: Non edematous BL  LE, warm and well perfused.

## 2011-01-22 NOTE — Assessment & Plan Note (Signed)
Blood pressure elevated today however he did not take his blood pressure medications. Typically it is well-controlled at home. Will have patient check his blood pressure at the [facility] once a week for the next month. Her blood pressure consistently over 145 systolic will return to clinic for titration of education.

## 2011-04-04 ENCOUNTER — Ambulatory Visit (INDEPENDENT_AMBULATORY_CARE_PROVIDER_SITE_OTHER): Payer: Self-pay | Admitting: Family Medicine

## 2011-04-04 ENCOUNTER — Encounter: Payer: Self-pay | Admitting: Family Medicine

## 2011-04-04 VITALS — BP 142/88 | HR 72 | Ht 69.75 in | Wt 185.0 lb

## 2011-04-04 DIAGNOSIS — I10 Essential (primary) hypertension: Secondary | ICD-10-CM

## 2011-04-04 MED ORDER — HYDROCHLOROTHIAZIDE 25 MG PO TABS: 25.0000 mg | ORAL_TABLET | Freq: Every day | ORAL | Status: DC

## 2011-04-04 NOTE — Progress Notes (Signed)
[name] is a 61 y.o. male who presents to [facility] today for hypertension. Has been out of hydrochlorothiazide recently. No chest pains palpitations dyspnea or edema or syncope. Feels well currently. Is walking approximately 2 miles a day but is still smoking..  Of note he is planning to move to [place] in one month.   PMH reviewed. Significant for hypertension and coronary artery disease. Is a smoker ROS as above otherwise neg Medications reviewed. Current Outpatient Prescriptions  Medication Sig Dispense Refill  . aspirin 81 MG tablet Take 81 mg by mouth daily.        Marland Kitchen atorvastatin (LIPITOR) 80 MG tablet Take 80 mg by mouth at bedtime.        . Polyethylene Glycol 3350 POWD 1 cap 1-2 times daily as needed for constipation.  Disp 1 large bottle.       . vardenafil (LEVITRA) 10 MG tablet Take 1 tablet (10 mg total) by mouth as needed for erectile dysfunction.  20 tablet  1  . hydrochlorothiazide (HYDRODIURIL) 25 MG tablet Take 1 tablet (25 mg total) by mouth daily.  30 tablet  12  . DISCONTD: hydrochlorothiazide 25 MG tablet Take 25 mg by mouth daily.          Exam:  BP 142/88  Pulse 72  Ht 5' 9.75" (1.772 m)  Wt 185 lb (83.915 kg)  BMI 26.74 kg/m2 Gen: Well NAD HEENT: EOMI,  MMM Lungs: CTABL Nl WOB Heart: RRR no MRG Abd: NABS, NT, ND Exts: Non edematous BL  LE, warm and well perfused.

## 2011-04-04 NOTE — Patient Instructions (Signed)
Thank you for coming in today. Please get set up with a doctor in [place].  Re-start your blood pressure medicine.  If you stay see me in 3 months.  Keep on your aspirin.

## 2011-04-04 NOTE — Assessment & Plan Note (Signed)
Hasn't taken his hydrochlorothiazide recently as he ran out. Blood pressure mildly elevated. Plan to restart and recheck if he follows up again. Encouraged him to seek out a new primary care doctor if he moves to [place].

## 2014-06-04 ENCOUNTER — Emergency Department (HOSPITAL_COMMUNITY)
Admission: EM | Admit: 2014-06-04 | Discharge: 2014-06-04 | Disposition: A | Discharge status: Home / Self Care | Payer: Medicare Other | Attending: Emergency Medicine | Admitting: Emergency Medicine

## 2014-06-04 ENCOUNTER — Emergency Department (HOSPITAL_COMMUNITY): Payer: Medicare Other

## 2014-06-04 ENCOUNTER — Encounter (HOSPITAL_COMMUNITY): Payer: Self-pay | Admitting: Emergency Medicine

## 2014-06-04 DIAGNOSIS — Z79899 Other long term (current) drug therapy: Secondary | ICD-10-CM | POA: Diagnosis not present

## 2014-06-04 DIAGNOSIS — N3289 Other specified disorders of bladder: Secondary | ICD-10-CM | POA: Diagnosis not present

## 2014-06-04 DIAGNOSIS — Z720 Tobacco use: Secondary | ICD-10-CM | POA: Diagnosis not present

## 2014-06-04 DIAGNOSIS — R109 Unspecified abdominal pain: Secondary | ICD-10-CM

## 2014-06-04 DIAGNOSIS — Z7982 Long term (current) use of aspirin: Secondary | ICD-10-CM | POA: Diagnosis not present

## 2014-06-04 DIAGNOSIS — N281 Cyst of kidney, acquired: Secondary | ICD-10-CM | POA: Diagnosis not present

## 2014-06-04 DIAGNOSIS — R1010 Upper abdominal pain, unspecified: Secondary | ICD-10-CM

## 2014-06-04 DIAGNOSIS — K7689 Other specified diseases of liver: Secondary | ICD-10-CM | POA: Insufficient documentation

## 2014-06-04 DIAGNOSIS — R0989 Other specified symptoms and signs involving the circulatory and respiratory systems: Secondary | ICD-10-CM | POA: Diagnosis not present

## 2014-06-04 DIAGNOSIS — K769 Liver disease, unspecified: Secondary | ICD-10-CM

## 2014-06-04 DIAGNOSIS — N390 Urinary tract infection, site not specified: Secondary | ICD-10-CM | POA: Diagnosis not present

## 2014-06-04 DIAGNOSIS — R509 Fever, unspecified: Secondary | ICD-10-CM | POA: Diagnosis not present

## 2014-06-04 LAB — COMPREHENSIVE METABOLIC PANEL
ALBUMIN: 4 g/dL (ref 3.5–5.2)
ALT: 11 U/L (ref 0–53)
AST: 14 U/L (ref 0–37)
Alkaline Phosphatase: 92 U/L (ref 39–117)
Anion gap: 9 (ref 5–15)
BILIRUBIN TOTAL: 2.2 mg/dL — AB (ref 0.3–1.2)
BUN: 13 mg/dL (ref 6–23)
CHLORIDE: 104 mmol/L (ref 96–112)
CO2: 25 mmol/L (ref 19–32)
Calcium: 9.4 mg/dL (ref 8.4–10.5)
Creatinine, Ser: 0.91 mg/dL (ref 0.50–1.35)
GFR calc Af Amer: >90 mL/min (ref >90)
GFR calc non Af Amer: 87 mL/min — ABNORMAL LOW (ref >90)
Glucose, Bld: 127 mg/dL — ABNORMAL HIGH (ref 70–99)
Potassium: 3.7 mmol/L (ref 3.5–5.1)
SODIUM: 138 mmol/L (ref 135–145)
Total Protein: 7.3 g/dL (ref 6.0–8.3)

## 2014-06-04 LAB — URINALYSIS, ROUTINE W REFLEX MICROSCOPIC
Bilirubin Urine: NEGATIVE
Glucose, UA: NEGATIVE mg/dL
Nitrite: POSITIVE — AB
UROBILINOGEN UA: 1 mg/dL (ref 0.0–1.0)
pH: 7 (ref 5.0–8.0)

## 2014-06-04 LAB — URINE MICROSCOPIC-ADD ON

## 2014-06-04 LAB — CBC WITH DIFFERENTIAL/PLATELET
BASOS ABS: 0 10*3/uL (ref 0.0–0.1)
Basophils Relative: 0 % (ref 0–1)
EOS PCT: 0 % (ref 0–5)
Eosinophils Absolute: 0 10*3/uL (ref 0.0–0.7)
HCT: 51.8 % (ref 39.0–52.0)
HEMOGLOBIN: 17.7 g/dL — AB (ref 13.0–17.0)
LYMPHS PCT: 19 % (ref 12–46)
Lymphs Abs: 2.1 10*3/uL (ref 0.7–4.0)
MCH: 29.5 pg (ref 26.0–34.0)
MCHC: 34.2 g/dL (ref 30.0–36.0)
MCV: 86.3 fL (ref 78.0–100.0)
Monocytes Absolute: 0.9 10*3/uL (ref 0.1–1.0)
Monocytes Relative: 8 % (ref 3–12)
NEUTROS ABS: 8.3 10*3/uL — AB (ref 1.7–7.7)
Neutrophils Relative %: 73 % (ref 43–77)
Platelets: 287 10*3/uL (ref 150–400)
RBC: 6 MIL/uL — AB (ref 4.22–5.81)
RDW: 14 % (ref 11.5–15.5)
WBC: 11.3 10*3/uL — AB (ref 4.0–10.5)

## 2014-06-04 LAB — LIPASE, BLOOD: Lipase: 22 U/L (ref 11–59)

## 2014-06-04 MED ORDER — IOHEXOL 300 MG/ML  SOLN
50.0000 mL | Freq: Once | INTRAMUSCULAR | Status: AC | PRN
  Administered 2014-06-04: 50 mL via ORAL

## 2014-06-04 MED ORDER — HYDROMORPHONE HCL 1 MG/ML IJ SOLN
1.0000 mg | Freq: Once | INTRAMUSCULAR | Status: AC
Start: 2014-06-04 — End: 2014-06-04
  Administered 2014-06-04: 1 mg via INTRAVENOUS
  Filled 2014-06-04: qty 1

## 2014-06-04 MED ORDER — ONDANSETRON HCL 4 MG/2ML IJ SOLN
4.0000 mg | Freq: Once | INTRAMUSCULAR | Status: AC
  Administered 2014-06-04: 4 mg via INTRAVENOUS
  Filled 2014-06-04: qty 2

## 2014-06-04 MED ORDER — TRAMADOL HCL 50 MG PO TABS: 50.0000 mg | ORAL_TABLET | Freq: Four times a day (QID) | ORAL | Status: DC | PRN

## 2014-06-04 MED ORDER — IOHEXOL 300 MG/ML  SOLN
100.0000 mL | Freq: Once | INTRAMUSCULAR | Status: AC | PRN
  Administered 2014-06-04: 100 mL via INTRAVENOUS

## 2014-06-04 MED ORDER — PANTOPRAZOLE SODIUM 40 MG IV SOLR
40.0000 mg | Freq: Once | INTRAVENOUS | Status: AC
  Administered 2014-06-04: 40 mg via INTRAVENOUS
  Filled 2014-06-04: qty 40

## 2014-06-04 MED ORDER — CEPHALEXIN 500 MG PO CAPS: 500.0000 mg | ORAL_CAPSULE | Freq: Four times a day (QID) | ORAL | Status: DC

## 2014-06-04 MED ORDER — DEXTROSE 5 % IV SOLN
1.0000 g | Freq: Once | INTRAVENOUS | Status: AC
  Administered 2014-06-04: 1 g via INTRAVENOUS
  Filled 2014-06-04: qty 10

## 2014-06-04 MED ORDER — SODIUM CHLORIDE 0.9 % IV BOLUS (SEPSIS)
1000.0000 mL | Freq: Once | INTRAVENOUS | Status: AC
  Administered 2014-06-04: 1000 mL via INTRAVENOUS

## 2014-06-04 NOTE — ED Provider Notes (Signed)
CSN: [id number]     Arrival date & time 06/04/14  1004 History   First MD Initiated Contact with Patient 06/04/14 1013     Chief Complaint  Patient presents with  . Abdominal Pain  . Nausea  . Emesis     (Consider location/radiation/quality/duration/timing/severity/associated sxs/prior Treatment) Patient is a 64 y.o. male presenting with abdominal pain and vomiting. The history is provided by the patient.  Abdominal Pain Associated symptoms: nausea and vomiting   Associated symptoms: no chest pain, no chills, no constipation, no diarrhea, no dysuria, no fever, no shortness of breath and no sore throat   Emesis Associated symptoms: abdominal pain   Associated symptoms: no chills, no diarrhea, no headaches and no sore throat   Patient c/o upper abdominal pain and nv for the past couple days. A few episodes of nv, brownish. No diarrhea. Had normal bm 2 days ago. Denies prior abd surgery. No hx same pain prior. No hx gallstones, pancreatitis or pud. Rarely drinks a single beer, no hx etoh abuse. Denies back or flank pain. abd pain is located mid to upper abd, bil, dull, moderate, cramping, non radiating. No dysuria, hematuria or gu c/o. No fever or chills. Occasional non prod cough. Smoker. No sore throat, runny nose, body aches or other uri c/o. No sob. No chest pain or discomfort.      History reviewed. No pertinent past medical history. History reviewed. No pertinent past surgical history. History reviewed. No pertinent family history. History  Substance Use Topics  . Smoking status: Current Every Day Smoker -- 0.50 packs/day    Types: Cigarettes  . Smokeless tobacco: Never Used  . Alcohol Use: Yes    Review of Systems  Constitutional: Negative for fever and chills.  HENT: Negative for sore throat.   Eyes: Negative for redness.  Respiratory: Negative for shortness of breath.   Cardiovascular: Negative for chest pain and leg swelling.  Gastrointestinal: Positive for nausea,  vomiting and abdominal pain. Negative for diarrhea and constipation.  Endocrine: Negative for polyuria.  Genitourinary: Negative for dysuria and flank pain.  Musculoskeletal: Negative for back pain and neck pain.  Skin: Negative for rash.  Neurological: Negative for headaches.  Hematological: Does not bruise/bleed easily.  Psychiatric/Behavioral: Negative for confusion.      Allergies  Review of patient's allergies indicates no known allergies.  Home Medications   Prior to Admission medications   Medication Sig Start Date End Date Taking? Authorizing Provider  aspirin 81 MG tablet Take 81 mg by mouth daily.      Historical Provider, MD  atorvastatin (LIPITOR) 80 MG tablet Take 80 mg by mouth at bedtime.      Historical Provider, MD  hydrochlorothiazide (HYDRODIURIL) 25 MG tablet Take 1 tablet (25 mg total) by mouth daily. 04/04/11   Rodolph Bong, MD  Polyethylene Glycol 3350 POWD 1 cap 1-2 times daily as needed for constipation.  Disp 1 large bottle.     Historical Provider, MD  vardenafil (LEVITRA) 10 MG tablet Take 1 tablet (10 mg total) by mouth as needed for erectile dysfunction. 01/22/11 01/22/12  Rodolph Bong, MD   BP 118/69 mmHg  Pulse 101  Temp(Src) 97.4 F (36.3 C) (Oral)  Resp 18  SpO2 100% Physical Exam  Constitutional: He is oriented to person, place, and time. He appears well-developed and well-nourished. No distress.  HENT:  Head: Atraumatic.  Mouth/Throat: Oropharynx is clear and moist.  Eyes: Conjunctivae are normal. No scleral icterus.  Neck: Neck supple. No  tracheal deviation present.  Cardiovascular: Normal rate, regular rhythm, normal heart sounds and intact distal pulses.  Exam reveals no gallop and no friction rub.   No murmur heard. Pulmonary/Chest: Effort normal and breath sounds normal. No accessory muscle usage. No respiratory distress.  Abdominal: Soft. Bowel sounds are normal. He exhibits no distension and no mass. There is tenderness. There is no  rebound and no guarding.  Mid to upper abd tenderness. No rebound or guarding. No incarc hernia. Normal bs.   Genitourinary:  No cva tenderness  Musculoskeletal: Normal range of motion. He exhibits no edema or tenderness.  Neurological: He is alert and oriented to person, place, and time.  Skin: Skin is warm and dry. He is not diaphoretic.  Psychiatric: He has a normal mood and affect.  Nursing note and vitals reviewed.   ED Course  Procedures (including critical care time) Labs Review  Results for orders placed or performed during the hospital encounter of 06/04/14  CBC with Differential  Result Value Ref Range   WBC 11.3 (H) 4.0 - 10.5 K/uL   RBC 6.00 (H) 4.22 - 5.81 MIL/uL   Hemoglobin 17.7 (H) 13.0 - 17.0 g/dL   HCT 16.1 09.6 - 04.5 %   MCV 86.3 78.0 - 100.0 fL   MCH 29.5 26.0 - 34.0 pg   MCHC 34.2 30.0 - 36.0 g/dL   RDW 40.9 81.1 - 91.4 %   Platelets 287 150 - 400 K/uL   Neutrophils Relative % 73 43 - 77 %   Neutro Abs 8.3 (H) 1.7 - 7.7 K/uL   Lymphocytes Relative 19 12 - 46 %   Lymphs Abs 2.1 0.7 - 4.0 K/uL   Monocytes Relative 8 3 - 12 %   Monocytes Absolute 0.9 0.1 - 1.0 K/uL   Eosinophils Relative 0 0 - 5 %   Eosinophils Absolute 0.0 0.0 - 0.7 K/uL   Basophils Relative 0 0 - 1 %   Basophils Absolute 0.0 0.0 - 0.1 K/uL  Comprehensive metabolic panel  Result Value Ref Range   Sodium 138 135 - 145 mmol/L   Potassium 3.7 3.5 - 5.1 mmol/L   Chloride 104 96 - 112 mmol/L   CO2 25 19 - 32 mmol/L   Glucose, Bld 127 (H) 70 - 99 mg/dL   BUN 13 6 - 23 mg/dL   Creatinine, Ser 7.82 0.50 - 1.35 mg/dL   Calcium 9.4 8.4 - 95.6 mg/dL   Total Protein 7.3 6.0 - 8.3 g/dL   Albumin 4.0 3.5 - 5.2 g/dL   AST 14 0 - 37 U/L   ALT 11 0 - 53 U/L   Alkaline Phosphatase 92 39 - 117 U/L   Total Bilirubin 2.2 (H) 0.3 - 1.2 mg/dL   GFR calc non Af Amer 87 (L) >90 mL/min   GFR calc Af Amer >90 >90 mL/min   Anion gap 9 5 - 15  Lipase, blood  Result Value Ref Range   Lipase 22 11 - 59  U/L  Urinalysis, Routine w reflex microscopic  Result Value Ref Range   Color, Urine YELLOW YELLOW   APPearance TURBID (A) CLEAR   Specific Gravity, Urine >1.030 (H) 1.005 - 1.030   pH 7.0 5.0 - 8.0   Glucose, UA NEGATIVE NEGATIVE mg/dL   Hgb urine dipstick MODERATE (A) NEGATIVE   Bilirubin Urine NEGATIVE NEGATIVE   Ketones, ur TRACE (A) NEGATIVE mg/dL   Protein, ur TRACE (A) NEGATIVE mg/dL   Urobilinogen, UA 1.0 0.0 - 1.0  mg/dL   Nitrite POSITIVE (A) NEGATIVE   Leukocytes, UA SMALL (A) NEGATIVE  Urine microscopic-add on  Result Value Ref Range   WBC, UA 0-2 <3 WBC/hpf   RBC / HPF 0-2 <3 RBC/hpf   Bacteria, UA MANY (A) RARE   Urine-Other AMORPHOUS URATES/PHOSPHATES    Ct Abdomen Pelvis W Contrast  06/04/2014   CLINICAL DATA:  Nausea with generalized abdominal pain for 1 day.  EXAM: CT ABDOMEN AND PELVIS WITH CONTRAST  TECHNIQUE: Multidetector CT imaging of the abdomen and pelvis was performed using the standard protocol following bolus administration of intravenous contrast.  CONTRAST:  50mL OMNIPAQUE IOHEXOL 300 MG/ML SOLN, OMNIPAQUE IOHEXOL 300 MG/ML SOLN  COMPARISON:  Abdominal series 16109 06/04/2014  FINDINGS: Lung bases are clear.  Negative for free intraperitoneal air.  There are two hyperdense structures in left hepatic lobe, largest measures 1 cm on sequence 2, image 14. These are nonspecific but could represent flash filling hemangiomas. There is a small sub cm low-density structure along the right hepatic dome which is also indeterminate and could represent a small cyst. Additional punctate low-density structures in the left hepatic lobe. Otherwise, normal appearance of the liver, gallbladder and portal venous system. There is fullness at ampulla of Vater but no significant pancreatic or biliary duct dilatation. No gross abnormality to the pancreas. Normal appearance of the spleen, adrenal glands and kidneys. Evidence for renal cysts, largest measuring 2.2 cm on left side. No  significant free fluid or lymphadenopathy.  There is irregular high density material along the right posterior bladder wall, seen on sequence 2, image 61. Bladder is mildly distended. No gross abnormality to the prostate or seminal vesicles. Normal appearance of the appendix. No acute bone abnormality. There is mild disc space narrowing with vacuum disc phenomena at L5-S1. No acute bone abnormality.  IMPRESSION: No acute abnormality in the abdomen or pelvis.  Fullness at the ampulla of Vater but there is no significant pancreatic or biliary duct dilatation. Findings are nonspecific.  Small calcified densities along the right posterior bladder wall. These calcifications could be involving the bladder wall versus small stones. Consider non-emergent urology consultation.  Small hyperdense and hypodense structures in the liver as described. These are likely incidental findings based on their size.  Renal cysts.   Electronically Signed   By: Richarda Overlie M.D.   On: 06/04/2014 14:33   Dg Abd Acute W/chest  06/04/2014   CLINICAL DATA:  Sudden onset of productive cough and fever. Congestion for 2 days.  EXAM: DG ABDOMEN ACUTE W/ 1V CHEST  COMPARISON:  12/21/2008  FINDINGS: Normal heart size.  Clear lungs.  Prominent stool burden within the colon. No free intraperitoneal gas. No abnormally dilated loops of bowel. No air-fluid levels. Vascular calcifications.  IMPRESSION: No active cardiopulmonary disease. Nonobstructive bowel gas pattern.   Electronically Signed   By: Jolaine Click M.D.   On: 06/04/2014 11:23       MDM   Iv ns bolus. protonix iv. Dilaudid 1 mg iv for pain. zofran iv.  Reviewed nursing notes and prior charts for additional history.   Labs.  Xrays.  Initially persistent c/o pain and abd tenderness on exam. Ct.  Discussed ct w pt, including neg acute, and also liver lesions, renal lesions, bladder calc, fullness at ampulla, and need for pcp f/u, discussion ct findings w pt, and pcp/pt to  arrange follow up.   Recheck pain resolved. Pt tolerating po fluids. abd soft nt. No vomiting or diarrhea. Afeb.  Pt currently appears stable for d/c.  Return precautions discussed and provided.     Cathren LaineKevin Diana Armijo, MD 06/05/14 [phone]

## 2014-06-04 NOTE — ED Notes (Signed)
Pt. Unable to urinate at this time. Will collect urine when pt. Voids. Nurse aware.  

## 2014-06-04 NOTE — ED Notes (Signed)
Pt states that he has been having gen abd pain with NV since yesterday.  Denies vomiting.

## 2014-06-04 NOTE — Discharge Instructions (Signed)
It was our pleasure to provide your ER care today - we hope that you feel better.  Rest. Drink plenty of fluids.  For abdominal symptoms, you may try taking pepcid and maalox as need for symptom relief.  The lab tests show a urine infection - take antibiotic (keflex) as prescribed.  A urine culture was sent the results of which will be back in 2-3 days - have primary care doctor follow up on those results then.  Your ct scan was read as follows:  IMPRESSION: No acute abnormality in the abdomen or pelvis.  Fullness at the ampulla of Vater but there is no significant pancreatic or biliary duct dilatation. Findings are nonspecific.  Small calcified densities along the right posterior bladder wall. These calcifications could be involving the bladder wall versus small stones. Consider non-emergent urology consultation.  Small hyperdense and hypodense structures in the liver as described. These are likely incidental findings based on their size.  Renal cysts.      For above CT scan findings, also follow up with primary care doctor in the next 1-2 weeks - discuss these results, and discuss with them arranging appropriate follow up.  Follow up with primary care doctor in the next few days for recheck - see referral - call office Monday to arrange appointment.  Return to ER right away if worse, new symptoms, fevers, worsening or severe pain, persistent vomiting, other concern.  You were given pain medication in the ER - no driving for the next 4 hours.      Abdominal Pain Many things can cause abdominal pain. Usually, abdominal pain is not caused by a disease and will improve without treatment. It can often be observed and treated at home. Your health care provider will do a physical exam and possibly order blood tests and X-rays to help determine the seriousness of your pain. However, in many cases, more time must pass before a clear cause of the pain can be found. Before that  point, your health care provider may not know if you need more testing or further treatment. HOME CARE INSTRUCTIONS  Monitor your abdominal pain for any changes. The following actions may help to alleviate any discomfort you are experiencing:  Only take over-the-counter or prescription medicines as directed by your health care provider.  Do not take laxatives unless directed to do so by your health care provider.  Try a clear liquid diet (broth, tea, or water) as directed by your health care provider. Slowly move to a bland diet as tolerated. SEEK MEDICAL CARE IF:  You have unexplained abdominal pain.  You have abdominal pain associated with nausea or diarrhea.  You have pain when you urinate or have a bowel movement.  You experience abdominal pain that wakes you in the night.  You have abdominal pain that is worsened or improved by eating food.  You have abdominal pain that is worsened with eating fatty foods.  You have a fever. SEEK IMMEDIATE MEDICAL CARE IF:   Your pain does not go away within 2 hours.  You keep throwing up (vomiting).  Your pain is felt only in portions of the abdomen, such as the right side or the left lower portion of the abdomen.  You pass bloody or black tarry stools. MAKE SURE YOU:  Understand these instructions.   Will watch your condition.   Will get help right away if you are not doing well or get worse.  Document Released: 11/07/2004 Document Revised: 02/02/2013 Document Reviewed: 10/07/2012  ExitCare Patient Information 2015 [place], [place]. This information is not intended to replace advice given to you by your health care provider. Make sure you discuss any questions you have with your health care provider.    Urinary Tract Infection Urinary tract infections (UTIs) can develop anywhere along your urinary tract. Your urinary tract is your body's drainage system for removing wastes and extra water. Your urinary tract includes two kidneys,  two ureters, a bladder, and a urethra. Your kidneys are a pair of bean-shaped organs. Each kidney is about the size of your fist. They are located below your ribs, one on each side of your spine. CAUSES Infections are caused by microbes, which are microscopic organisms, including fungi, viruses, and bacteria. These organisms are so small that they can only be seen through a microscope. Bacteria are the microbes that most commonly cause UTIs. SYMPTOMS  Symptoms of UTIs may vary by age and gender of the patient and by the location of the infection. Symptoms in young women typically include a frequent and intense urge to urinate and a painful, burning feeling in the bladder or urethra during urination. Older women and men are more likely to be tired, shaky, and weak and have muscle aches and abdominal pain. A fever may mean the infection is in your kidneys. Other symptoms of a kidney infection include pain in your back or sides below the ribs, nausea, and vomiting. DIAGNOSIS To diagnose a UTI, your caregiver will ask you about your symptoms. Your caregiver also will ask to provide a urine sample. The urine sample will be tested for bacteria and white blood cells. White blood cells are made by your body to help fight infection. TREATMENT  Typically, UTIs can be treated with medication. Because most UTIs are caused by a bacterial infection, they usually can be treated with the use of antibiotics. The choice of antibiotic and length of treatment depend on your symptoms and the type of bacteria causing your infection. HOME CARE INSTRUCTIONS  If you were prescribed antibiotics, take them exactly as your caregiver instructs you. Finish the medication even if you feel better after you have only taken some of the medication.  Drink enough water and fluids to keep your urine clear or pale yellow.  Avoid caffeine, tea, and carbonated beverages. They tend to irritate your bladder.  Empty your bladder often. Avoid  holding urine for long periods of time.  Empty your bladder before and after sexual intercourse.  After a bowel movement, women should cleanse from front to back. Use each tissue only once. SEEK MEDICAL CARE IF:   You have back pain.  You develop a fever.  Your symptoms do not begin to resolve within 3 days. SEEK IMMEDIATE MEDICAL CARE IF:   You have severe back pain or lower abdominal pain.  You develop chills.  You have nausea or vomiting.  You have continued burning or discomfort with urination. MAKE SURE YOU:   Understand these instructions.  Will watch your condition.  Will get help right away if you are not doing well or get worse. Document Released: 11/07/2004 Document Revised: 07/30/2011 Document Reviewed: 03/08/2011 [facility] Patient Information 2015 [place], [place]. This information is not intended to replace advice given to you by your health care provider. Make sure you discuss any questions you have with your health care provider.

## 2014-06-06 LAB — URINE CULTURE
Colony Count: NO GROWTH
Culture: NO GROWTH

## 2015-04-05 ENCOUNTER — Encounter: Payer: Self-pay | Admitting: Obstetrics and Gynecology

## 2015-04-05 ENCOUNTER — Ambulatory Visit (INDEPENDENT_AMBULATORY_CARE_PROVIDER_SITE_OTHER): Payer: Medicare Other | Admitting: Obstetrics and Gynecology

## 2015-04-05 VITALS — BP 144/77 | HR 79 | Temp 97.6°F | Wt 165.8 lb

## 2015-04-05 DIAGNOSIS — Z0000 Encounter for general adult medical examination without abnormal findings: Secondary | ICD-10-CM

## 2015-04-05 DIAGNOSIS — Z1211 Encounter for screening for malignant neoplasm of colon: Secondary | ICD-10-CM | POA: Diagnosis not present

## 2015-04-05 DIAGNOSIS — Z7189 Other specified counseling: Secondary | ICD-10-CM | POA: Diagnosis not present

## 2015-04-05 DIAGNOSIS — Z716 Tobacco abuse counseling: Secondary | ICD-10-CM

## 2015-04-05 DIAGNOSIS — Z23 Encounter for immunization: Secondary | ICD-10-CM | POA: Diagnosis not present

## 2015-04-05 DIAGNOSIS — K219 Gastro-esophageal reflux disease without esophagitis: Secondary | ICD-10-CM | POA: Diagnosis not present

## 2015-04-05 DIAGNOSIS — I10 Essential (primary) hypertension: Secondary | ICD-10-CM

## 2015-04-05 DIAGNOSIS — Z800 Family history of malignant neoplasm of digestive organs: Secondary | ICD-10-CM | POA: Diagnosis not present

## 2015-04-05 DIAGNOSIS — Z7689 Persons encountering health services in other specified circumstances: Secondary | ICD-10-CM

## 2015-04-05 NOTE — Progress Notes (Signed)
     Subjective: Chief Complaint  Patient presents with  . New Patient (Initial Visit)    Establish care    HPI: [name] is a 65 y.o. presenting to clinic today to establish care as a new patient. Patient previously followed in our clinic about 3 years ago. Stopepd coming because he had movede to [place].   Reports that he has had no new medical issues during that time.   No acute concerns today.  Past Medical History  Diagnosis Date  . Hypertension   . GERD (gastroesophageal reflux disease)   . Cataract    Past Surgical History  Procedure Laterality Date  . Eye surgery      cataract   Social History   Social History  . Marital Status: Married    Spouse Name: N/A  . Number of Children: N/A  . Years of Education: N/A   Occupational History  . Not on file.   Social History Main Topics  . Smoking status: Current Every Day Smoker -- 2.00 packs/day for 50 years    Types: Cigarettes  . Smokeless tobacco: Never Used  . Alcohol Use: No  . Drug Use: No  . Sexual Activity: No   Other Topics Concern  . Not on file   Social History Narrative   Family History  Problem Relation Age of Onset  . Heart disease Mother   . Hypertension Mother   . Diabetes Mother   . Cancer Father     Health Maintenance: Need Hep screen, colonoscopy, zostovax, PCV23, and flu.   Review of Systems  Constitutional: Negative.  HENT: Negative.  Eyes: Impaired vision.   Respiratory: Negative.  Cardiovascular: Negative.  Gastrointestinal: Negative.  Genitourinary: Negative.  Musculoskeletal: Negative.  Skin: Negative.  Neurological: Negative.  Endo/Heme/Allergies: Negative.  Psychiatric/Behavioral: Negative.  Objective: BP 144/77 mmHg  Pulse 79  Temp(Src) 97.6 F (36.4 C) (Oral)  Wt 165 lb 12.8 oz (75.206 kg) Vitals and nursing notes reviewed  Physical Exam  Constitutional: He is oriented to person, place, and time and well-developed,  well-nourished, and in no distress.  HENT:  Head: Normocephalic and atraumatic.  Right Ear: External ear normal.  Left Ear: External ear normal.  Nose: Nose normal.  Mouth/Throat: Oropharynx is clear and moist.  Eyes: EOM are normal. Pupils are equal, round, and reactive to light.  Neck: Normal range of motion. Neck supple. No thyromegaly present.  Cardiovascular: Normal rate, regular rhythm, normal heart sounds and intact distal pulses.   Pulmonary/Chest: Effort normal and breath sounds normal.  Abdominal: Soft. Bowel sounds are normal. He exhibits no mass. There is no tenderness.  Musculoskeletal: Normal range of motion. He exhibits no edema or tenderness.  Neurological: He is alert and oriented to person, place, and time. No cranial nerve deficit. He exhibits normal muscle tone.  Skin: Skin is warm and dry.  Psychiatric: Mood and affect normal.    Assessment/Plan: Please see problem based Assessment and Plan   Orders Placed This Encounter  Procedures  . Ambulatory referral to Gastroenterology    Referral Priority:  Routine    Referral Type:  Consultation    Referral Reason:  Specialty Services Required    Number of Visits Requested:  1    Caryl Ada, DO 04/05/2015, 1:42 PM PGY-2, [facility]

## 2015-04-05 NOTE — Patient Instructions (Addendum)
[name] it was great to meet you today!  I am pleased to hear that things are going well for you.  Here are some of the things we discussed today: -Try to reduce number of cigarettes you use. We can discuss medical management to help with stopping smoking at next visit -would like to get blood work and screen for lung cancer at next visit -No need for BP medication at this time.  -You received flu and Pneumococcal vaccine today -Someone will call you about colonoscopy   Please schedule a follow-up appointment for 4 weeks to discuss quitting smoking and get blood work    Thanks for allowing me to be a part of your care! Dr. Elyse Hsu Can Quit Smoking If you are ready to quit smoking or are thinking about it, congratulations! You have chosen to help yourself be healthier and live longer! There are lots of different ways to quit smoking. Nicotine gum, nicotine patches, a nicotine inhaler, or nicotine nasal spray can help with physical craving. Hypnosis, support groups, and medicines help break the habit of smoking. TIPS TO GET OFF AND STAY OFF CIGARETTES  Learn to predict your moods. Do not let a bad situation be your excuse to have a cigarette. Some situations in your life might tempt you to have a cigarette.  Ask friends and co-workers not to smoke around you.  Make your home smoke-free.  Never have "just one" cigarette. It leads to wanting another and another. Remind yourself of your decision to quit.  On a card, make a list of your reasons for not smoking. Read it at least the same number of times a day as you have a cigarette. Tell yourself everyday, "I do not want to smoke. I choose not to smoke."  Ask someone at home or work to help you with your plan to quit smoking.  Have something planned after you eat or have a cup of coffee. Take a walk or get other exercise to perk you up. This will help to keep you from overeating.  Try a relaxation exercise to calm  you down and decrease your stress. Remember, you may be tense and nervous the first two weeks after you quit. This will pass.  Find new activities to keep your hands busy. Play with a pen, coin, or rubber band. Doodle or draw things on paper.  Brush your teeth right after eating. This will help cut down the craving for the taste of tobacco after meals. You can try mouthwash too.  Try gum, breath mints, or diet candy to keep something in your mouth. IF YOU SMOKE AND WANT TO QUIT:  Do not stock up on cigarettes. Never buy a carton. Wait until one pack is finished before you buy another.  Never carry cigarettes with you at work or at home.  Keep cigarettes as far away from you as possible. Leave them with someone else.  Never carry matches or a lighter with you.  Ask yourself, "Do I need this cigarette or is this just a reflex?"  Bet with someone that you can quit. Put cigarette money in a piggy bank every morning. If you smoke, you give up the money. If you do not smoke, by the end of the week, you keep the money.  Keep trying. It takes 21 days to change a habit!  Talk to your doctor about using medicines to help you quit. These include nicotine replacement gum, lozenges,  or skin patches.   This information is not intended to replace advice given to you by your health care provider. Make sure you discuss any questions you have with your health care provider.   Document Released: 11/24/2008 Document Revised: 04/22/2011 Document Reviewed: 11/24/2008 Elsevier Interactive Patient Education [employer].

## 2015-04-05 NOTE — Assessment & Plan Note (Signed)
Uses Pepcid daily. Controls symptoms.

## 2015-04-05 NOTE — Assessment & Plan Note (Signed)
Referral placed for patient to have colonoscopy. Patient received flu vaccine and pneumococcal 23 vaccine. Due for Zostavax.

## 2015-04-05 NOTE — Assessment & Plan Note (Addendum)
Patient with greater than 44-pack-year history. He has been smoking since he was 65 years old. Currently smokes 2 packs per day which is an increase from last year. Discussed tobacco cessation with patient. He is contemplating quitting and will like to discuss further. Patient is candidate for lung cancer screening. Denies any respiratory or breathing issues from smoking history. Patient to schedule follow-up appointment within the next 4 weeks to go over tobacco cessation medical treatment. Handout given on tobacco cessation and quit line number.

## 2015-04-05 NOTE — Assessment & Plan Note (Addendum)
History of hypertension not currently on medication. Has not been on medication for over 3 years. Blood pressure in goal range today. All blood pressure less than 150/90. Encouraged patient to continue with some modifications to control blood pressure. Also encouraged tobacco cessation. He declined blood work at this time and will follow-up in the next 4 weeks.

## 2015-05-05 ENCOUNTER — Ambulatory Visit (INDEPENDENT_AMBULATORY_CARE_PROVIDER_SITE_OTHER): Payer: Medicare Other | Admitting: Obstetrics and Gynecology

## 2015-05-05 ENCOUNTER — Encounter: Payer: Self-pay | Admitting: Obstetrics and Gynecology

## 2015-05-05 VITALS — BP 136/62 | HR 67 | Temp 97.7°F | Wt 165.5 lb

## 2015-05-05 DIAGNOSIS — I10 Essential (primary) hypertension: Secondary | ICD-10-CM | POA: Diagnosis not present

## 2015-05-05 DIAGNOSIS — Z716 Tobacco abuse counseling: Secondary | ICD-10-CM | POA: Diagnosis not present

## 2015-05-05 DIAGNOSIS — F17210 Nicotine dependence, cigarettes, uncomplicated: Secondary | ICD-10-CM | POA: Diagnosis not present

## 2015-05-05 DIAGNOSIS — E7800 Pure hypercholesterolemia, unspecified: Secondary | ICD-10-CM | POA: Diagnosis not present

## 2015-05-05 DIAGNOSIS — F17200 Nicotine dependence, unspecified, uncomplicated: Secondary | ICD-10-CM

## 2015-05-05 LAB — CBC
HEMATOCRIT: 48 % (ref 39.0–52.0)
Hemoglobin: 16.2 g/dL (ref 13.0–17.0)
MCH: 28.9 pg (ref 26.0–34.0)
MCHC: 33.8 g/dL (ref 30.0–36.0)
MCV: 85.6 fL (ref 78.0–100.0)
MPV: 9.8 fL (ref 8.6–12.4)
Platelets: 297 10*3/uL (ref 150–400)
RBC: 5.61 MIL/uL (ref 4.22–5.81)
RDW: 14.4 % (ref 11.5–15.5)
WBC: 5.7 10*3/uL (ref 4.0–10.5)

## 2015-05-05 MED ORDER — NICOTINE 21 MG/24HR TD PT24: 21.0000 mg | MEDICATED_PATCH | Freq: Every day | TRANSDERMAL | Status: DC

## 2015-05-05 MED ORDER — BUPROPION HCL ER (SR) 150 MG PO TB12: 150.0000 mg | ORAL_TABLET | Freq: Two times a day (BID) | ORAL | Status: DC

## 2015-05-05 NOTE — Assessment & Plan Note (Signed)
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 Multiple barriers identified to limit success. Current every day smoker of 2-3 packs per day. Patient wishing to quit smoking. Counseling given. Plan is to set quit day in the next week. Then initiate nicotine patches. Nicotine patch (21 mg/day) for six weeks, followed by 14 mg/day for two weeks, and finish with 7 mg/day for two weeks. Also will do bupropion tx. Starting one week before target quit date, buproprion 150 mg/day for three days, then 150 mg twice a day thereafter.  Denies history of seizures. Patient counseled on purpose, proper use, and potential adverse effects, including insomnia, and potential change in mood. Quit line given. CT scan scheduled to screen for lung cancer in high risk patient. Written information provided.  F/U pharmacy clinic in 2 weeks.

## 2015-05-05 NOTE — Assessment & Plan Note (Signed)
Lipid panel collected today. Not currently on medication for cholesterol.

## 2015-05-05 NOTE — Assessment & Plan Note (Signed)
Blood work obtained today. Pressures are within normal limits. Controlled without medications.

## 2015-05-05 NOTE — Progress Notes (Signed)
     Subjective: Chief Complaint  Patient presents with  . Nicotine Dependence    Pt wans to quit smoking     HPI: [name] is a 65 y.o. presenting to clinic today to discuss the following:  #Quitting Smoking:  Patient arrives for evaluation/assistance with tobacco dependence.  Age when started using tobacco on a daily basis 15. Number of Cigarettes per day: Smokes 2-3 ppd. Brand smoked l&m menthol.  Smokes first cigarette within 5 minutes after waking. Wakes up at night to go to bathroom and smokes during that time  Estimated Fagerstrom Score 7/10.   Most recent quit attempt a while ago. Tried to eat candy to replace tobacco.  Longest time ever been tobacco free 2 weeks ( while hospitalized) Wife also smokes.  Never tried any medications to help with quitting.   Rates IMPORTANCE of quitting tobacco on 1-10 scale of  10 - "doesn't want to die" Rates CONFIDENCE of quitting tobacco on 1-10 scale of 10  Most common triggers to use tobacco include: cigratreete with coffess, after eating, before bed,   Motivation to quit: doesn't want to die or get cancer  #Hypertension Blood pressure at home: does not monitor  Blood pressure today: Controlled Taking Meds: None Requesting blood work ROS: Denies headache, dizziness, visual changes, nausea, vomiting, chest pain, abdominal pain or shortness of breath.   ROS noted in HPI.  Past Medical, Surgical, Social, and Family History Reviewed & Updated per EMR.   Objective: BP 136/62 mmHg  Pulse 67  Temp(Src) 97.7 F (36.5 C) (Oral)  Wt 165 lb 8 oz (75.07 kg) Vitals and nursing notes reviewed  Physical Exam  Constitutional: He is well-developed, well-nourished, and in no distress.  HENT:  Mouth/Throat: Oropharynx is clear and moist and mucous membranes are normal. Abnormal dentition.  Cardiovascular: Normal rate, regular rhythm and normal heart sounds.   Pulmonary/Chest: Effort normal and breath sounds normal. He has  no wheezes. He has no rales.    Assessment/Plan: Please see problem based Assessment and Plan   Total time in face-to-face counseling >30 minutes.   Orders Placed This Encounter  Procedures  . CT CHEST LUNG CA SCREEN LOW DOSE W/O CM    Standing Status: Future     Number of Occurrences:      Standing Expiration Date: 07/04/2016    Order Specific Question:  Reason for Exam (SYMPTOM  OR DIAGNOSIS REQUIRED)    Answer:  screening for lung cancer    Order Specific Question:  Preferred Imaging [facility]?    [facility]  . CBC  . Lipid panel  . BASIC METABOLIC PANEL WITH GFR    Meds ordered this encounter  Medications  . nicotine (NICODERM CQ - DOSED IN MG/24 HOURS) 21 mg/24hr patch    Sig: Place 1 patch (21 mg total) onto the skin daily.    Dispense:  28 patch    Refill:  1  . buPROPion (WELLBUTRIN SR) 150 MG 12 hr tablet    Sig: Take 1 tablet (150 mg total) by mouth 2 (two) times daily.    Dispense:  30 tablet    Refill:  1     Caryl AdaJazma Nathanyel Defenbaugh, DO 05/05/2015, 8:39 AM PGY-2, [facility]

## 2015-05-05 NOTE — Patient Instructions (Addendum)
Set a quit day!!!!  Quit line:  1 800-QUIT-NOW support program.    Starting on the quit day, nicotine patch (21 mg/day) for six weeks, followed by 14 mg/day for two weeks, and finish with 7 mg/day for two weeks.   Starting one week before target quit date. Start taking buproprion 150 mg/day for three days(1 pill a day), then 150 mg twice a day thereafter (1 pilll twice a day).    CT scan scheduled  Will contact you about blood work  Please schedule a follow-up appointment with pharmacy clinic for tobacco cessation in 2 weeks. Follow-up with me in 4 weeks.    Steps to Quit Smoking  Smoking tobacco can be harmful to your health and can affect almost every organ in your body. Smoking puts you, and those around you, at risk for developing many serious chronic diseases. Quitting smoking is difficult, but it is one of the best things that you can do for your health. It is never too late to quit. WHAT ARE THE BENEFITS OF QUITTING SMOKING? When you quit smoking, you lower your risk of developing serious diseases and conditions, such as:  Lung cancer or lung disease, such as COPD.  Heart disease.  Stroke.  Heart attack.  Infertility.  Osteoporosis and bone fractures. Additionally, symptoms such as coughing, wheezing, and shortness of breath may get better when you quit. You may also find that you get sick less often because your body is stronger at fighting off colds and infections. If you are pregnant, quitting smoking can help to reduce your chances of having a baby of low birth weight. HOW DO I GET READY TO QUIT? When you decide to quit smoking, create a plan to make sure that you are successful. Before you quit:  Pick a date to quit. Set a date within the next two weeks to give you time to prepare.  Write down the reasons why you are quitting. Keep this list in places where you will see it often, such as on your bathroom mirror or in your car or wallet.  Identify the people,  places, things, and activities that make you want to smoke (triggers) and avoid them. Make sure to take these actions:  Throw away all cigarettes at home, at work, and in your car.  Throw away smoking accessories, such as Set designer.  Clean your car and make sure to empty the ashtray.  Clean your home, including curtains and carpets.  Tell your family, friends, and coworkers that you are quitting. Support from your loved ones can make quitting easier.  Talk with your health care provider about your options for quitting smoking.  Find out what treatment options are covered by your health insurance. WHAT STRATEGIES CAN I USE TO QUIT SMOKING?  Talk with your healthcare provider about different strategies to quit smoking. Some strategies include:  Quitting smoking altogether instead of gradually lessening how much you smoke over a period of time. Research shows that quitting "cold [place]" is more successful than gradually quitting.  Attending in-person counseling to help you build problem-solving skills. You are more likely to have success in quitting if you attend several counseling sessions. Even short sessions of 10 minutes can be effective.  Finding resources and support systems that can help you to quit smoking and remain smoke-free after you quit. These resources are most helpful when you use them often. They can include:  Online chats with a Veterinary surgeon.  Telephone quitlines.  Printed Materials engineer.  Support groups or group counseling.  Text messaging programs.  Mobile phone applications.  Taking medicines to help you quit smoking. (If you are pregnant or breastfeeding, talk with your health care provider first.) Some medicines contain nicotine and some do not. Both types of medicines help with cravings, but the medicines that include nicotine help to relieve withdrawal symptoms. Your health care provider may recommend:  Nicotine patches, gum, or  lozenges.  Nicotine inhalers or sprays.  Non-nicotine medicine that is taken by mouth. Talk with your health care provider about combining strategies, such as taking medicines while you are also receiving in-person counseling. Using these two strategies together makes you more likely to succeed in quitting than if you used either strategy on its own. If you are pregnant or breastfeeding, talk with your health care provider about finding counseling or other support strategies to quit smoking. Do not take medicine to help you quit smoking unless told to do so by your health care provider. WHAT THINGS CAN I DO TO MAKE IT EASIER TO QUIT? Quitting smoking might feel overwhelming at first, but there is a lot that you can do to make it easier. Take these important actions:  Reach out to your family and friends and ask that they support and encourage you during this time. Call telephone quitlines, reach out to support groups, or work with a counselor for support.  Ask people who smoke to avoid smoking around you.  Avoid places that trigger you to smoke, such as bars, parties, or smoke-break areas at work.  Spend time around people who do not smoke.  Lessen stress in your life, because stress can be a smoking trigger for some people. To lessen stress, try:  Exercising regularly.  Deep-breathing exercises.  Yoga.  Meditating.  Performing a body scan. This involves closing your eyes, scanning your body from head to toe, and noticing which parts of your body are particularly tense. Purposefully relax the muscles in those areas.  Download or purchase mobile phone or tablet apps (applications) that can help you stick to your quit plan by providing reminders, tips, and encouragement. There are many free apps, such as QuitGuide from the [employer] developer(Centers for Disease Control and Prevention). You can find other support for quitting smoking (smoking cessation) through smokefree.gov and other websites. HOW  WILL I FEEL WHEN I QUIT SMOKING? Within the first 24 hours of quitting smoking, you may start to feel some withdrawal symptoms. These symptoms are usually most noticeable 2-3 days after quitting, but they usually do not last beyond 2-3 weeks. Changes or symptoms that you might experience include:  Mood swings.  Restlessness, anxiety, or irritation.  Difficulty concentrating.  Dizziness.  Strong cravings for sugary foods in addition to nicotine.  Mild weight gain.  Constipation.  Nausea.  Coughing or a sore throat.  Changes in how your medicines work in your body.  A depressed mood.  Difficulty sleeping (insomnia). After the first 2-3 weeks of quitting, you may start to notice more positive results, such as:  Improved sense of smell and taste.  Decreased coughing and sore throat.  Slower heart rate.  Lower blood pressure.  Clearer skin.  The ability to breathe more easily.  Fewer sick days. Quitting smoking is very challenging for most people. Do not get discouraged if you are not successful the first time. Some people need to make many attempts to quit before they achieve long-term success. Do your best to stick to your quit plan, and talk with  your health care provider if you have any questions or concerns.   This information is not intended to replace advice given to you by your health care provider. Make sure you discuss any questions you have with your health care provider.   Document Released: 01/22/2001 Document Revised: 06/14/2014 Document Reviewed: 06/14/2014 Elsevier Interactive Patient Education [employer].

## 2015-05-07 ENCOUNTER — Encounter: Payer: Self-pay | Admitting: Obstetrics and Gynecology

## 2015-05-08 ENCOUNTER — Ambulatory Visit (HOSPITAL_COMMUNITY): Admission: RE | Admit: 2015-05-08 | Payer: Medicare Other | Source: Ambulatory Visit

## 2015-05-08 ENCOUNTER — Ambulatory Visit (HOSPITAL_COMMUNITY)
Admission: RE | Admit: 2015-05-08 | Discharge: 2015-05-08 | Disposition: A | Discharge status: Home / Self Care | Payer: Medicare Other | Source: Ambulatory Visit | Attending: Family Medicine | Admitting: Family Medicine

## 2015-05-08 DIAGNOSIS — Z122 Encounter for screening for malignant neoplasm of respiratory organs: Secondary | ICD-10-CM | POA: Insufficient documentation

## 2015-05-08 DIAGNOSIS — R911 Solitary pulmonary nodule: Secondary | ICD-10-CM | POA: Insufficient documentation

## 2015-05-08 DIAGNOSIS — F17210 Nicotine dependence, cigarettes, uncomplicated: Secondary | ICD-10-CM | POA: Diagnosis not present

## 2015-05-08 DIAGNOSIS — Z87891 Personal history of nicotine dependence: Secondary | ICD-10-CM | POA: Diagnosis not present

## 2015-05-08 DIAGNOSIS — F17200 Nicotine dependence, unspecified, uncomplicated: Secondary | ICD-10-CM

## 2015-05-10 ENCOUNTER — Other Ambulatory Visit: Payer: Self-pay | Admitting: Obstetrics and Gynecology

## 2015-05-10 LAB — BASIC METABOLIC PANEL WITH GFR
BUN: 8 mg/dL (ref 7–25)
CHLORIDE: 103 mmol/L (ref 98–110)
CO2: 25 mmol/L (ref 20–31)
Calcium: 9.3 mg/dL (ref 8.6–10.3)
Creat: 0.85 mg/dL (ref 0.70–1.25)
GFR, Est African American: >89 mL/min (ref >=60)
Glucose, Bld: 94 mg/dL (ref 65–99)
POTASSIUM: 4 mmol/L (ref 3.5–5.3)
Sodium: 140 mmol/L (ref 135–146)

## 2015-05-10 LAB — LIPID PANEL
Cholesterol: 221 mg/dL — ABNORMAL HIGH (ref 125–200)
HDL: 43 mg/dL (ref >=40)
LDL Cholesterol: 163 mg/dL — ABNORMAL HIGH (ref <130)
TRIGLYCERIDES: 77 mg/dL (ref <150)
Total CHOL/HDL Ratio: 5.1 Ratio — ABNORMAL HIGH (ref <=5.0)
VLDL: 15 mg/dL (ref <30)

## 2015-05-10 MED ORDER — ATORVASTATIN CALCIUM 80 MG PO TABS: 80.0000 mg | ORAL_TABLET | Freq: Every day | ORAL | Status: DC

## 2015-05-19 ENCOUNTER — Encounter: Payer: Self-pay | Admitting: Pharmacist

## 2015-05-19 ENCOUNTER — Ambulatory Visit (INDEPENDENT_AMBULATORY_CARE_PROVIDER_SITE_OTHER): Payer: Medicare Other | Admitting: Pharmacist

## 2015-05-19 VITALS — Ht 70.5 in | Wt 168.5 lb

## 2015-05-19 DIAGNOSIS — E7800 Pure hypercholesterolemia, unspecified: Secondary | ICD-10-CM

## 2015-05-19 DIAGNOSIS — F17210 Nicotine dependence, cigarettes, uncomplicated: Secondary | ICD-10-CM

## 2015-05-19 NOTE — Assessment & Plan Note (Signed)
History of Severe Nicotine Dependence of 100 pack years duration in a patient who is excellent candidate for success b/c of his great motivation to quit, no longer has a desire to smoke at all, has support from his wife. At this time Mr. [name] is not interested in using any medications to help him quit smoking, he wants to quit on his own. We will f/u with him with a phone call in 1 week on 05/26/15 and likely weekly thereafter. Total time in face-to-face counseling 30 minutes.  Patient seen with Arcola JanskyMeagan Decker, PharmD Resident.

## 2015-05-19 NOTE — Assessment & Plan Note (Signed)
Has not yet started his atorvastatin.

## 2015-05-19 NOTE — Progress Notes (Signed)
S:  Patient arrives feeling well, very pleasant. He is proud of the progress he has made in his smoking cessation and reports he has not smoked in the past 2 days.   Patient arrives for evaluation/assistance with tobacco dependence.  Patient was referred on 05/05/15.  Patient was last seen by Primary Care Provider on 05/05/15.   Age when started using tobacco on a daily basis 14. Number of Cigarettes per day 40. Estimated Nicotine intake per day 40 mg.   Smokes first cigarette 5 minutes after waking. Denies waking to smoke.    Estimated Fagerstrom Score >6.   Longest time ever been tobacco free 5 months.   Although prescribed bupropion and nicotine patches at this time, he is not interested in using any pharmacological treatment and wishes to quit on his own.   Pt reports a great understanding of the importance to quit smoking. He mentions a recent lung evaluation where he had a spot on his lung and this also motivates him to quit smoking. He is very confident in his ability to quit smoking.  Most common triggers to use tobacco include; being around other smokers, family stress with recent death of grandson.   Motivation to quit: Very motivated to quit, repeatedly states "I don't want to smoke anymore". Has gotten rid of all cigarettes in the house and his wife is also quitting smoking.  A/P: History of Severe Nicotine Dependence of 100 pack years duration in a patient who is excellent candidate for success b/c of his great motivation to quit, no longer has a desire to smoke at all, has support from his wife. At this time Mr. [name] is not interested in using any medications to help him quit smoking, he wants to quit on his own. We will f/u with him with a phone call in 1 week on 05/26/15 and likely weekly thereafter. Total time in face-to-face counseling 30 minutes.  Patient seen with Arcola JanskyMeagan Decker, PharmD Resident.

## 2015-05-19 NOTE — Patient Instructions (Signed)
Continue working to remain tobacco free. Take it one day at a time. You are doing GREAT so far!!!!!  Continue to walk as long as it is comfortable for you. Try to increase the amount you walk every day.  Try to increase the amount of water you drink per day.   We will call you to check on you in a week.

## 2015-05-19 NOTE — Progress Notes (Signed)
Patient ID: [name], male   DOB: 30-Jul-1950, 65 y.o.   MRN: [id number] Reviewed: Agree with Dr. Macky LowerKoval's documentation and management.

## 2015-05-25 ENCOUNTER — Telehealth: Payer: Self-pay | Admitting: Pharmacist

## 2015-05-25 NOTE — Telephone Encounter (Signed)
Follow up call with patient - her reports no smoking since visit on 05/19/2015 Appreciative of the call and willing to have more f/u via phone in 1-2 weeks.  Continue to be good candidate for long-term successful abstinence from tobacco.

## 2015-06-08 ENCOUNTER — Telehealth: Payer: Self-pay | Admitting: Pharmacist

## 2015-06-08 NOTE — Telephone Encounter (Signed)
f/u for tobacco cessation.   States he has smoked 1 cig after a meal for the last 2 days.   Willing to attempt complete cessation again.   Follow phone call in 1-2 weeks.

## 2015-06-15 ENCOUNTER — Telehealth: Payer: Self-pay | Admitting: Pharmacist

## 2015-06-15 NOTE — Telephone Encounter (Signed)
Patient contacted in follow up of tobacco cessation 1 month ago.   States he is doing very well and believes he is NOT smoking again.  Encouraged to continue his efforts.   No change in plan.   Phone F/U in 2 weeks.

## 2015-06-29 ENCOUNTER — Telehealth: Payer: Self-pay | Admitting: Pharmacist

## 2015-06-29 NOTE — Telephone Encounter (Signed)
Phone call follow up - patient reported smoking ~ 3 times per week.   States smoking is from smoking "others" cigarettes.   He is NOT buying any cigarettes.  He also states his smoking is related to stress.   He is willing to attempt 1 week no smoking.   Encouraged continued abstinence.   Willing to attempt tobacco cessation for 1 week.   Phone follow-up again in 1 week.

## 2015-07-05 ENCOUNTER — Encounter: Payer: Self-pay | Admitting: Obstetrics and Gynecology

## 2015-07-05 ENCOUNTER — Ambulatory Visit (INDEPENDENT_AMBULATORY_CARE_PROVIDER_SITE_OTHER): Payer: Medicare Other | Admitting: Obstetrics and Gynecology

## 2015-07-05 VITALS — BP 119/78 | HR 102 | Temp 98.3°F | Ht 71.0 in | Wt 159.6 lb

## 2015-07-05 DIAGNOSIS — F17210 Nicotine dependence, cigarettes, uncomplicated: Secondary | ICD-10-CM

## 2015-07-05 DIAGNOSIS — R519 Headache, unspecified: Secondary | ICD-10-CM

## 2015-07-05 DIAGNOSIS — Z8719 Personal history of other diseases of the digestive system: Secondary | ICD-10-CM

## 2015-07-05 DIAGNOSIS — R51 Headache: Secondary | ICD-10-CM

## 2015-07-05 NOTE — Patient Instructions (Signed)
Follow-up with dentist No indication for medicine at this time Seek help if you have any fever, facial swelling, unable to eat, or any of the below.  Dental Abscess A dental abscess is pus in or around a tooth. HOME CARE  Take medicines only as told by your dentist.  If you were prescribed antibiotic medicine, finish all of it even if you start to feel better.  Rinse your mouth (gargle) often with salt water.  Do not drive or use heavy machinery, like a lawn mower, while taking pain medicine.  Do not apply heat to the outside of your mouth.  Keep all follow-up visits as told by your dentist. This is important. GET HELP IF:  Your pain is worse, and medicine does not help. GET HELP RIGHT AWAY IF:  You have a fever or chills.  Your symptoms suddenly get worse.  You have a very bad headache.  You have problems breathing or swallowing.  You have trouble opening your mouth.  You have puffiness (swelling) in your neck or around your eye.   This information is not intended to replace advice given to you by your health care provider. Make sure you discuss any questions you have with your health care provider.   Document Released: 06/14/2014 Document Reviewed: 06/14/2014 Elsevier Interactive Patient Education [employer]2016 Elsevier Inc.

## 2015-07-06 NOTE — Progress Notes (Signed)
     Subjective: Chief Complaint  Patient presents with  . Headache    x 1 week     HPI: [name] is a 65 y.o. presenting to clinic today to discuss the following:  #HEADACHE Headache started about a week ago According to patient pain related to dental abscess he had Went to see a dentist and has surgery scheduled in 2 weeks to have tooth removed Severity was a 10/10; nothing relieved pain States he woke up and his jaw and side of face on the left was swollen Location: left side Currently denying hadache now States that tooth started draining and symtpoms resolved States it was so bad medicine for sleeping did not help Denies any vision changes, weakness Medications tried: ibuprofen minimally helped Now able to eat Sudden onset: yes Previous similar headaches: no  Symptoms Nausea vomiting: no Photophobia: no Noise sensitivity: no Fever: yes Neck Stiffness: no  #Tooth abscess -has follow-up with dentist in 2 weeks -on left bottom back tooth -drained purulent fluid  #Tobacco use -states he is down to one cig a day -mainly just with breakfast in AM -states it is still hard for him to be around others who smoke -has counselor calling everyday checking on his progress   ROS noted in HPI.  Past Medical, Surgical, Social, and Family History Reviewed & Updated per EMR. Smoking status - Smoker   Objective: BP 119/78 mmHg  Pulse 102  Temp(Src) 98.3 F (36.8 C) (Oral)  Ht 5\' 11"  (1.803 m)  Wt 159 lb 9.6 oz (72.394 kg)  BMI 22.27 kg/m2 Vitals and nursing notes reviewed  Physical Exam  Constitutional: He is oriented to person, place, and time and well-developed, well-nourished, and in no distress.  HENT:  Mouth/Throat: Oropharynx is clear and moist and mucous membranes are normal. Abnormal dentition. Dental caries present. No dental abscesses.  submandibular lymphadenopathy  Pulmonary/Chest: Effort normal and breath sounds normal.  Neurological: He is  alert and oriented to person, place, and time. He has normal motor skills, normal sensation and normal strength. No cranial nerve deficit.    Assessment/Plan: Please see problem based Assessment and Plan    Caryl AdaJazma Achaia Garlock, DO PGY-2, [facility]

## 2015-07-13 DIAGNOSIS — Z8719 Personal history of other diseases of the digestive system: Secondary | ICD-10-CM | POA: Insufficient documentation

## 2015-07-13 DIAGNOSIS — R51 Headache: Secondary | ICD-10-CM

## 2015-07-13 DIAGNOSIS — R519 Headache, unspecified: Secondary | ICD-10-CM | POA: Insufficient documentation

## 2015-07-13 NOTE — Assessment & Plan Note (Signed)
H/o severe nicotine dependence. Patient still current everyday smoker but now down to one cigarette daily. Not on any medications to help with quitting. He is highly motivated to quit. Will continue to encourage him.

## 2015-07-13 NOTE — Assessment & Plan Note (Signed)
No signs of tooth abscess on exam today. Afebrile and well appearing. No tenderness to jaw palpation. Some submandibular lymphadenopathy. Patient has poor dentition. Has dental follow-up. No need for treatment at this time. Return precautions discussed.

## 2015-07-13 NOTE — Assessment & Plan Note (Signed)
No headache currently. Neuro exam normal. Most likely related to his dental abscess that he had that drained.

## 2015-07-14 ENCOUNTER — Telehealth: Payer: Self-pay | Admitting: Pharmacist

## 2015-07-14 NOTE — Telephone Encounter (Signed)
Follow up of tobacco cessation attempt 4/7 attempted.     No answer - no answering machine available.

## 2015-11-15 ENCOUNTER — Encounter: Payer: Self-pay | Admitting: Gastroenterology

## 2015-11-15 ENCOUNTER — Encounter: Payer: Self-pay | Admitting: Obstetrics and Gynecology

## 2015-11-15 ENCOUNTER — Telehealth: Payer: Self-pay | Admitting: Obstetrics and Gynecology

## 2015-11-15 ENCOUNTER — Ambulatory Visit (INDEPENDENT_AMBULATORY_CARE_PROVIDER_SITE_OTHER): Payer: Medicare Other | Admitting: Obstetrics and Gynecology

## 2015-11-15 VITALS — BP 140/82 | HR 76 | Temp 98.1°F | Ht 71.0 in | Wt 164.0 lb

## 2015-11-15 DIAGNOSIS — F17210 Nicotine dependence, cigarettes, uncomplicated: Secondary | ICD-10-CM

## 2015-11-15 DIAGNOSIS — Z1211 Encounter for screening for malignant neoplasm of colon: Secondary | ICD-10-CM

## 2015-11-15 DIAGNOSIS — Z23 Encounter for immunization: Secondary | ICD-10-CM | POA: Diagnosis not present

## 2015-11-15 DIAGNOSIS — M545 Low back pain, unspecified: Secondary | ICD-10-CM

## 2015-11-15 DIAGNOSIS — Z0000 Encounter for general adult medical examination without abnormal findings: Secondary | ICD-10-CM

## 2015-11-15 DIAGNOSIS — I10 Essential (primary) hypertension: Secondary | ICD-10-CM

## 2015-11-15 MED ORDER — BACLOFEN 10 MG PO TABS: 10.0000 mg | ORAL_TABLET | Freq: Three times a day (TID) | ORAL | 0 refills | Status: DC | PRN

## 2015-11-15 NOTE — Progress Notes (Signed)
Subjective:   65 y.o. year old male presents for preventative visit and annual physical.   Acute Concerns: Back/Shoulder Pain. Patient states that about 2 weeks ago he was moving heavy furniture and hurt his back and shoulder. No radicular symptoms.   Diet: Processed food. Poor diet. Eating more since he quit smoking. Likes soda  Exercise: No, but he walks everywhere  Sexual History: currently sexually active with wife  POA/Living Will: no  Current Outpatient Prescriptions  Medication Sig Dispense Refill  . atorvastatin (LIPITOR) 80 MG tablet Take 1 tablet (80 mg total) by mouth at bedtime. (Patient not taking: Reported on 05/19/2015) 30 tablet 1  . buPROPion (WELLBUTRIN SR) 150 MG 12 hr tablet Take 1 tablet (150 mg total) by mouth 2 (two) times daily. (Patient not taking: Reported on 05/19/2015) 30 tablet 1  . famotidine (PEPCID) 20 MG tablet Take 20 mg by mouth 2 (two) times daily. Reported on 05/05/2015    . nicotine (NICODERM CQ - DOSED IN MG/24 HOURS) 21 mg/24hr patch Place 1 patch (21 mg total) onto the skin daily. (Patient not taking: Reported on 05/19/2015) 28 patch 1   No current facility-administered medications for this visit.    Allergies: Review of patient's allergies indicates no known allergies.   Social:  Social History   Social History  . Marital status: Married    Spouse name: N/A  . Number of children: N/A  . Years of education: N/A   Social History Main Topics  . Smoking status: Former Smoker    Packs/day: 2.00    Years: 50.00    Types: Cigarettes    Quit date: 08/19/2015  . Smokeless tobacco: Never Used  . Alcohol use No  . Drug use: No  . Sexual activity: No   Other Topics Concern  . None   Social History Narrative  . None    Immunization: Immunization History  Administered Date(s) Administered  . Influenza Split 01/22/2011  . Influenza Whole 11/12/2007, 03/29/2009, 11/15/2009  . Influenza,inj,Quad PF,36+ Mos 04/05/2015  . Pneumococcal  Polysaccharide-23 03/29/2009, 04/05/2015  . Td 06/11/2005   Health Maintenance Due  Topic Date Due  . Hepatitis C Screening  November 24, 1950  . COLONOSCOPY  12/03/1998  . ZOSTAVAX  12/02/2008  . TETANUS/TDAP  06/12/2015  . INFLUENZA VACCINE  09/12/2015   Review of Systems: Per HPI. Feeling well. No dyspnea or chest pain on exertion. No abdominal pain, change in bowel habits, black or bloody stools. No urinary tract or prostatic symptoms. No neurological complaints.   Objective:  BP 140/82 (BP Location: Left Arm, Patient Position: Sitting, Cuff Size: Normal)   Pulse 76   Temp 98.1 F (36.7 C) (Oral)   Ht 5\' 11"  (1.803 m)   Wt 164 lb (74.4 kg)   SpO2 98%   BMI 22.87 kg/m   Gen:  65 y.o. male in NAD.  HEENT: NCAT, MMM, EOMI, PERRL, anicteric sclerae CV: RRR, no MRG, no JVD Resp: Non-labored, CTAB, no wheezes noted Abd: Soft, NTND, BS present, no guarding or organomegaly Ext: WWP, no edema Skin: No rashes   MSK: Full ROM, strength intact Neuro: Alert and oriented, speech normal       Chemistry      Component Value Date/Time   NA 140 05/05/2015 0913   K 4.0 05/05/2015 0913   CL 103 05/05/2015 0913   CO2 25 05/05/2015 0913   BUN 8 05/05/2015 0913   CREATININE 0.85 05/05/2015 0913      Component Value Date/Time  CALCIUM 9.3 05/05/2015 0913   ALKPHOS 92 06/04/2014 1034   AST 14 06/04/2014 1034   ALT 11 06/04/2014 1034   BILITOT 2.2 (H) 06/04/2014 1034      Lab Results  Component Value Date   WBC 5.7 05/05/2015   HGB 16.2 05/05/2015   HCT 48.0 05/05/2015   MCV 85.6 05/05/2015   PLT 297 05/05/2015   Lab Results  Component Value Date   TSH 2.3 12/26/2009   Lab Results  Component Value Date   HGBA1C 5.6 12/26/2009     ASSESSMENT & PLAN: 65 y.o. male presents for annual preventative exam.   ASSESSMENT:  healthy adult male  PLAN:  begin progressive daily aerobic exercise program, follow a low fat, low cholesterol diet, reduce salt in diet and cooking,  continue current medications and return for routine annual checkups  HYPERTENSION, BENIGN SYSTEMIC At goal today. Continue current regimen.   Back pain Appears to be a back strain from heavy lifting. Benign exam. No red flag signs. Conservative management given to patient including light stretches, heat, and OTC NSAID. Rx for baclofen given. Handout provided. Patient to return in 6 weeks if not improved.   Preventative health care Flu vaccine given today. Referral for colonoscopy placed.   Cigarette nicotine dependence without complication H/o severe nicotine dependence. Patint now has quit somking x3 months. Congradtulatded patient on this acoomplishment. Encouaged continued cessation. Needs repeat low dose CT scan next year for 2.0 mm subpleural nodule in the right lower lobe seen on screening CT 04/2015.  Orders Placed This Encounter  Procedures  . Flu Vaccine QUAD 36+ mos IM  . Ambulatory referral to Gastroenterology    Referral Priority:   Routine    Referral Type:   Consultation    Referral Reason:   Specialty Services Required    Number of Visits Requested:   1   Meds ordered this encounter  Medications  . baclofen (LIORESAL) 10 MG tablet    Sig: Take 1 tablet (10 mg total) by mouth 3 (three) times daily as needed for muscle spasms.    Dispense:  30 each    Refill:  0    Pincus Large, DO PGY-3,  [facility] 11/15/2015 7:26 AM

## 2015-11-15 NOTE — Patient Instructions (Addendum)
Take ibuprofen 600mg  every 4-6 hrs as needed for back pain Return in 4 weeks if not improved may need to work up further Congrats on quitting smoking Put in referral for colonoscopy  Back Pain, Adult Back pain is very common. The pain often gets better over time. The cause of back pain is usually not dangerous. Most people can learn to manage their back pain on their own.  HOME CARE  Watch your back pain for any changes. The following actions may help to lessen any pain you are feeling:  Stay active. Start with short walks on flat ground if you can. Try to walk farther each day.  Exercise regularly as told by your doctor. Exercise helps your back heal faster. It also helps avoid future injury by keeping your muscles strong and flexible.  Do not sit, drive, or stand in one place for more than 30 minutes.  Do not stay in bed. Resting more than 1-2 days can slow down your recovery.  Be careful when you bend or lift an object. Use good form when lifting:  Bend at your knees.  Keep the object close to your body.  Do not twist.  Sleep on a firm mattress. Lie on your side, and bend your knees. If you lie on your back, put a pillow under your knees.  Take medicines only as told by your doctor.  Put ice on the injured area.  Put ice in a plastic bag.  Place a towel between your skin and the bag.  Leave the ice on for 20 minutes, 2-3 times a day for the first 2-3 days. After that, you can switch between ice and heat packs.  Avoid feeling anxious or stressed. Find good ways to deal with stress, such as exercise.  Maintain a healthy weight. Extra weight puts stress on your back. GET HELP IF:   You have pain that does not go away with rest or medicine.  You have worsening pain that goes down into your legs or buttocks.  You have pain that does not get better in one week.  You have pain at night.  You lose weight.  You have a fever or chills. GET HELP RIGHT AWAY IF:   You  cannot control when you poop (bowel movement) or pee (urinate).  Your arms or legs feel weak.  Your arms or legs lose feeling (numbness).  You feel sick to your stomach (nauseous) or throw up (vomit).  You have belly (abdominal) pain.  You feel like you may pass out (faint).   This information is not intended to replace advice given to you by your health care provider. Make sure you discuss any questions you have with your health care provider.   Document Released: 07/17/2007 Document Revised: 02/18/2014 Document Reviewed: 06/01/2013 Elsevier Interactive Patient Education 2016 Elsevier Inc.   Back Exercises If you have pain in your back, do these exercises 2-3 times each day or as told by your doctor. When the pain goes away, do the exercises once each day, but repeat the steps more times for each exercise (do more repetitions). If you do not have pain in your back, do these exercises once each day or as told by your doctor. EXERCISES Single Knee to Chest Do these steps 3-5 times in a row for each leg: 1. Lie on your back on a firm bed or the floor with your legs stretched out. 2. Bring one knee to your chest. 3. Hold your knee to your chest  by grabbing your knee or thigh. 4. Pull on your knee until you feel a gentle stretch in your lower back. 5. Keep doing the stretch for 10-30 seconds. 6. Slowly let go of your leg and straighten it. Pelvic Tilt Do these steps 5-10 times in a row: 1. Lie on your back on a firm bed or the floor with your legs stretched out. 2. Bend your knees so they point up to the ceiling. Your feet should be flat on the floor. 3. Tighten your lower belly (abdomen) muscles to press your lower back against the floor. This will make your tailbone point up to the ceiling instead of pointing down to your feet or the floor. 4. Stay in this position for 5-10 seconds while you gently tighten your muscles and breathe evenly. Cat-Cow Do these steps until your lower  back bends more easily: 1. Get on your hands and knees on a firm surface. Keep your hands under your shoulders, and keep your knees under your hips. You may put padding under your knees. 2. Let your head hang down, and make your tailbone point down to the floor so your lower back is round like the back of a cat. 3. Stay in this position for 5 seconds. 4. Slowly lift your head and make your tailbone point up to the ceiling so your back hangs low (sags) like the back of a cow. 5. Stay in this position for 5 seconds. Press-Ups Do these steps 5-10 times in a row: 1. Lie on your belly (face-down) on the floor. 2. Place your hands near your head, about shoulder-width apart. 3. While you keep your back relaxed and keep your hips on the floor, slowly straighten your arms to raise the top half of your body and lift your shoulders. Do not use your back muscles. To make yourself more comfortable, you may change where you place your hands. 4. Stay in this position for 5 seconds. 5. Slowly return to lying flat on the floor. Bridges Do these steps 10 times in a row: 1. Lie on your back on a firm surface. 2. Bend your knees so they point up to the ceiling. Your feet should be flat on the floor. 3. Tighten your butt muscles and lift your butt off of the floor until your waist is almost as high as your knees. If you do not feel the muscles working in your butt and the back of your thighs, slide your feet 1-2 inches farther away from your butt. 4. Stay in this position for 3-5 seconds. 5. Slowly lower your butt to the floor, and let your butt muscles relax. If this exercise is too easy, try doing it with your arms crossed over your chest. Belly Crunches Do these steps 5-10 times in a row: 1. Lie on your back on a firm bed or the floor with your legs stretched out. 2. Bend your knees so they point up to the ceiling. Your feet should be flat on the floor. 3. Cross your arms over your chest. 4. Tip your chin a  little bit toward your chest but do not bend your neck. 5. Tighten your belly muscles and slowly raise your chest just enough to lift your shoulder blades a tiny bit off of the floor. 6. Slowly lower your chest and your head to the floor. Back Lifts Do these steps 5-10 times in a row: 1. Lie on your belly (face-down) with your arms at your sides, and rest your forehead on  the floor. 2. Tighten the muscles in your legs and your butt. 3. Slowly lift your chest off of the floor while you keep your hips on the floor. Keep the back of your head in line with the curve in your back. Look at the floor while you do this. 4. Stay in this position for 3-5 seconds. 5. Slowly lower your chest and your face to the floor. GET HELP IF:  Your back pain gets a lot worse when you do an exercise.  Your back pain does not lessen 2 hours after you exercise. If you have any of these problems, stop doing the exercises. Do not do them again unless your doctor says it is okay. GET HELP RIGHT AWAY IF:  You have sudden, very bad back pain. If this happens, stop doing the exercises. Do not do them again unless your doctor says it is okay.   This information is not intended to replace advice given to you by your health care provider. Make sure you discuss any questions you have with your health care provider.   Document Released: 03/02/2010 Document Revised: 10/19/2014 Document Reviewed: 03/24/2014 Elsevier Interactive Patient Education [employer]

## 2015-11-22 ENCOUNTER — Encounter: Payer: Self-pay | Admitting: Obstetrics and Gynecology

## 2015-11-22 NOTE — Assessment & Plan Note (Signed)
H/o severe nicotine dependence. Patint now has quit somking x3 months. Congradtulatded patient on this acoomplishment. Encouaged continued cessation. Needs repeat low dose CT scan next year for 2.0 mm subpleural nodule in the right lower lobe seen on screening CT 04/2015.

## 2015-11-22 NOTE — Assessment & Plan Note (Addendum)
Flu vaccine given today. Referral for colonoscopy placed.

## 2015-11-22 NOTE — Assessment & Plan Note (Signed)
At goal today. Continue current regimen.  

## 2015-11-22 NOTE — Assessment & Plan Note (Signed)
Appears to be a back strain from heavy lifting. Benign exam. No red flag signs. Conservative management given to patient including light stretches, heat, and OTC NSAID. Rx for baclofen given. Handout provided. Patient to return in 6 weeks if not improved.

## 2015-11-28 ENCOUNTER — Other Ambulatory Visit: Payer: Self-pay | Admitting: Obstetrics and Gynecology

## 2015-11-28 NOTE — Telephone Encounter (Signed)
Patient came by office wanted prescription for Viagra to be called into [facility] on [address]. Patient does not have phone # to be reached at.

## 2015-11-29 MED ORDER — SILDENAFIL CITRATE 50 MG PO TABS: 50.0000 mg | ORAL_TABLET | Freq: Every day | ORAL | 0 refills | Status: DC | PRN

## 2015-11-29 NOTE — Telephone Encounter (Signed)
Has h/o ED. Has not been worked up or seen for it since 2012. If able to reach patient please advise him that any additional refills will require office visit to discuss further and work-up since it has been a while.

## 2015-12-27 ENCOUNTER — Encounter: Payer: Self-pay | Admitting: Obstetrics and Gynecology

## 2016-01-09 ENCOUNTER — Encounter: Payer: Medicare Other | Admitting: Gastroenterology

## 2016-03-31 ENCOUNTER — Encounter (HOSPITAL_COMMUNITY): Payer: Self-pay | Admitting: *Deleted

## 2016-03-31 ENCOUNTER — Emergency Department (HOSPITAL_COMMUNITY)
Admission: EM | Admit: 2016-03-31 | Discharge: 2016-03-31 | Disposition: A | Discharge status: Home / Self Care | Payer: Medicare Other | Attending: Emergency Medicine | Admitting: Emergency Medicine

## 2016-03-31 DIAGNOSIS — I10 Essential (primary) hypertension: Secondary | ICD-10-CM | POA: Insufficient documentation

## 2016-03-31 DIAGNOSIS — R197 Diarrhea, unspecified: Secondary | ICD-10-CM | POA: Insufficient documentation

## 2016-03-31 DIAGNOSIS — R112 Nausea with vomiting, unspecified: Secondary | ICD-10-CM | POA: Diagnosis not present

## 2016-03-31 DIAGNOSIS — Z87891 Personal history of nicotine dependence: Secondary | ICD-10-CM | POA: Diagnosis not present

## 2016-03-31 DIAGNOSIS — Z79899 Other long term (current) drug therapy: Secondary | ICD-10-CM | POA: Diagnosis not present

## 2016-03-31 LAB — COMPREHENSIVE METABOLIC PANEL
ALBUMIN: 3.6 g/dL (ref 3.5–5.0)
ALT: 16 U/L — AB (ref 17–63)
AST: 37 U/L (ref 15–41)
Alkaline Phosphatase: 67 U/L (ref 38–126)
Anion gap: 11 (ref 5–15)
BUN: 15 mg/dL (ref 6–20)
CHLORIDE: 98 mmol/L — AB (ref 101–111)
CO2: 21 mmol/L — ABNORMAL LOW (ref 22–32)
CREATININE: 0.92 mg/dL (ref 0.61–1.24)
Calcium: 8.6 mg/dL — ABNORMAL LOW (ref 8.9–10.3)
GFR calc Af Amer: >60 mL/min (ref >60)
GFR calc non Af Amer: >60 mL/min (ref >60)
Glucose, Bld: 111 mg/dL — ABNORMAL HIGH (ref 65–99)
Potassium: 3.5 mmol/L (ref 3.5–5.1)
SODIUM: 130 mmol/L — AB (ref 135–145)
Total Bilirubin: 1.4 mg/dL — ABNORMAL HIGH (ref 0.3–1.2)
Total Protein: 7.1 g/dL (ref 6.5–8.1)

## 2016-03-31 LAB — CBC WITH DIFFERENTIAL/PLATELET
BASOS ABS: 0 10*3/uL (ref 0.0–0.1)
BASOS PCT: 0 %
EOS ABS: 0 10*3/uL (ref 0.0–0.7)
Eosinophils Relative: 0 %
HCT: 45.6 % (ref 39.0–52.0)
HEMOGLOBIN: 16.2 g/dL (ref 13.0–17.0)
Lymphocytes Relative: 13 %
Lymphs Abs: 1 10*3/uL (ref 0.7–4.0)
MCH: 28.9 pg (ref 26.0–34.0)
MCHC: 35.5 g/dL (ref 30.0–36.0)
MCV: 81.3 fL (ref 78.0–100.0)
Monocytes Absolute: 0.8 10*3/uL (ref 0.1–1.0)
Monocytes Relative: 10 %
NEUTROS PCT: 77 %
Neutro Abs: 6.3 10*3/uL (ref 1.7–7.7)
Platelets: 208 10*3/uL (ref 150–400)
RBC: 5.61 MIL/uL (ref 4.22–5.81)
RDW: 13.4 % (ref 11.5–15.5)
WBC: 8.2 10*3/uL (ref 4.0–10.5)

## 2016-03-31 LAB — LIPASE, BLOOD: Lipase: 39 U/L (ref 11–51)

## 2016-03-31 MED ORDER — ONDANSETRON HCL 4 MG/2ML IJ SOLN
4.0000 mg | Freq: Once | INTRAMUSCULAR | Status: AC
  Administered 2016-03-31: 4 mg via INTRAVENOUS
  Filled 2016-03-31: qty 2

## 2016-03-31 MED ORDER — ONDANSETRON 4 MG PO TBDP: 4.0000 mg | ORAL_TABLET | Freq: Three times a day (TID) | ORAL | 0 refills | Status: DC | PRN

## 2016-03-31 MED ORDER — SODIUM CHLORIDE 0.9 % IV BOLUS (SEPSIS)
1000.0000 mL | Freq: Once | INTRAVENOUS | Status: AC
  Administered 2016-03-31: 1000 mL via INTRAVENOUS

## 2016-03-31 MED ORDER — DICYCLOMINE HCL 10 MG PO CAPS
20.0000 mg | ORAL_CAPSULE | Freq: Once | ORAL | Status: AC
  Administered 2016-03-31: 20 mg via ORAL
  Filled 2016-03-31: qty 2

## 2016-03-31 NOTE — ED Triage Notes (Signed)
Pt states that he began having N/V/D last night; pt states that it has progressed today; pt states that he has vomited numerous times and states "I cannot hold down fluids"

## 2016-03-31 NOTE — ED Provider Notes (Signed)
[facility]-EMERGENCY DEPT Provider Note   CSN: [id number] Arrival date & time: 03/31/16  0059   By signing my name below, I, [name], attest that this documentation has been prepared under the direction and in the presence of Clifford Crumble, MD. Electronically signed, [name], ED Scribe. 03/31/16. 1:56 AM.   History   Chief Complaint Chief Complaint  Patient presents with  . Emesis   The history is provided by the patient and medical records. No language interpreter was used.    HPI Comments: [name] is a 66 y.o. male who presents to the Emergency Department complaining of N/V/D x 2-3 days. He states he vomits after eating or drinking fluids. Wife states both vomiting and diarrhea are profuse. He adds associated productive cough and "hiccups". 1 sick contact noted at home with similar symptoms. Pt denies hematemesis or bloody vomit.  Past Medical History:  Diagnosis Date  . Cataract   . GERD (gastroesophageal reflux disease)   . Hypertension     Patient Active Problem List   Diagnosis Date Noted  . Cigarette nicotine dependence without complication 05/05/2015  . Preventative health care 04/05/2015  . GERD 08/23/2008  . ERECTILE DYSFUNCTION 10/16/2006  . HYPERCHOLESTEROLEMIA 04/10/2006  . HYPERTENSION, BENIGN SYSTEMIC 04/10/2006  . Back pain 04/10/2006    Past Surgical History:  Procedure Laterality Date  . EYE SURGERY     cataract       Home Medications    Prior to Admission medications   Medication Sig Start Date End Date Taking? Authorizing Provider  atorvastatin (LIPITOR) 80 MG tablet Take 1 tablet (80 mg total) by mouth at bedtime. Patient not taking: Reported on 05/19/2015 05/10/15   Pincus Large, DO  baclofen (LIORESAL) 10 MG tablet Take 1 tablet (10 mg total) by mouth 3 (three) times daily as needed for muscle spasms. 11/15/15   Pincus Large, DO  buPROPion (WELLBUTRIN SR) 150 MG 12 hr tablet Take 1 tablet (150 mg total) by mouth 2 (two)  times daily. Patient not taking: Reported on 05/19/2015 05/05/15   Pincus Large, DO  famotidine (PEPCID) 20 MG tablet Take 20 mg by mouth 2 (two) times daily. Reported on 05/05/2015    Historical Provider, MD  nicotine (NICODERM CQ - DOSED IN MG/24 HOURS) 21 mg/24hr patch Place 1 patch (21 mg total) onto the skin daily. Patient not taking: Reported on 05/19/2015 05/05/15   Pincus Large, DO  sildenafil (VIAGRA) 50 MG tablet Take 1 tablet (50 mg total) by mouth daily as needed for erectile dysfunction. 11/29/15   Pincus Large, DO    Family History Family History  Problem Relation Age of Onset  . Heart disease Mother   . Hypertension Mother   . Diabetes Mother   . Cancer Father     colon and prostate    Social History Social History  Substance Use Topics  . Smoking status: Former Smoker    Packs/day: 2.00    Years: 50.00    Types: Cigarettes    Quit date: 08/19/2015  . Smokeless tobacco: Never Used  . Alcohol use No     Allergies   Patient has no known allergies.   Review of Systems Review of Systems  All other systems reviewed and are negative.  A complete 10 system review of systems was obtained and all systems are negative except as noted in the HPI and PMH.    Physical Exam Updated Vital Signs BP 129/83 (BP Location: Left Arm)  Pulse 98   Temp 99.4 F (37.4 C) (Oral)   Resp 18   SpO2 96%   Physical Exam  Constitutional: He is oriented to person, place, and time. Vital signs are normal. He appears well-developed and well-nourished.  Non-toxic appearance. He does not appear ill. No distress.  HENT:  Head: Normocephalic and atraumatic.  Nose: Nose normal.  Mouth/Throat: Oropharynx is clear and moist. No oropharyngeal exudate.  Eyes: Conjunctivae and EOM are normal. Pupils are equal, round, and reactive to light. No scleral icterus.  Neck: Normal range of motion. Neck supple. No tracheal deviation, no edema, no erythema and normal range of motion present. No  thyroid mass and no thyromegaly present.  Cardiovascular: Normal rate, regular rhythm, S1 normal, S2 normal, normal heart sounds, intact distal pulses and normal pulses.  Exam reveals no gallop and no friction rub.   No murmur heard. Pulmonary/Chest: Effort normal and breath sounds normal. No respiratory distress. He has no wheezes. He has no rhonchi. He has no rales.  Abdominal: Soft. Normal appearance and bowel sounds are normal. He exhibits no distension, no ascites and no mass. There is no hepatosplenomegaly. There is no tenderness. There is no rebound, no guarding and no CVA tenderness.  Musculoskeletal: Normal range of motion. He exhibits no edema or tenderness.  Lymphadenopathy:    He has no cervical adenopathy.  Neurological: He is alert and oriented to person, place, and time. He has normal strength. No cranial nerve deficit or sensory deficit.  Skin: Skin is warm, dry and intact. No petechiae and no rash noted. He is not diaphoretic. No erythema. No pallor.  Nursing note and vitals reviewed.    ED Treatments / Results  DIAGNOSTIC STUDIES: Oxygen Saturation is 96% on RA, adequate by my interpretation.    COORDINATION OF CARE: 1:54 AM Discussed treatment plan with pt at bedside and pt agreed to plan. Will order labs, IV and Zofran then reassess.  Labs (all labs ordered are listed, but only abnormal results are displayed) Labs Reviewed  COMPREHENSIVE METABOLIC PANEL - Abnormal; Notable for the following:       Result Value   Sodium 130 (*)    Chloride 98 (*)    CO2 21 (*)    Glucose, Bld 111 (*)    Calcium 8.6 (*)    ALT 16 (*)    Total Bilirubin 1.4 (*)    All other components within normal limits  CBC WITH DIFFERENTIAL/PLATELET  LIPASE, BLOOD    EKG  EKG Interpretation None       Radiology No results found.  Procedures Procedures (including critical care time)  Medications Ordered in ED Medications  sodium chloride 0.9 % bolus 1,000 mL (1,000 mLs  Intravenous New Bag/Given 03/31/16 0226)  ondansetron (ZOFRAN) injection 4 mg (4 mg Intravenous Given 03/31/16 0226)  dicyclomine (BENTYL) capsule 20 mg (20 mg Oral Given 03/31/16 0232)     Initial Impression / Assessment and Plan / ED Course  I have reviewed the triage vital signs and the nursing notes.  Pertinent labs & imaging results that were available during my care of the patient were reviewed by me and considered in my medical decision making (see chart for details).     Patient presents to the ED for N/V/D after sick contacts with his granddaughter who has the exact same symptoms. WIll obtain labs, give zofran and bentyl as well as IVF.     2:57 AM Labs show mild dehydration.  Plan to DC home with  zofran PRN.  Instructions to stay well hydrated.  PCP fu advised within 3 days. HE appears well and in NAD.  VS are normal. Patietn safe for DC.    I personally performed the services described in this documentation, which was scribed in my presence. The recorded information has been reviewed and is accurate.     Final Clinical Impressions(s) / ED Diagnoses   Final diagnoses:  None    New Prescriptions New Prescriptions   No medications on file     Clifford CrumbleAdeleke Jabron Weese, MD 03/31/16 [phone]

## 2017-01-07 ENCOUNTER — Ambulatory Visit (INDEPENDENT_AMBULATORY_CARE_PROVIDER_SITE_OTHER): Payer: Medicare Other | Admitting: *Deleted

## 2017-01-07 DIAGNOSIS — Z23 Encounter for immunization: Secondary | ICD-10-CM | POA: Diagnosis present

## 2017-06-10 ENCOUNTER — Ambulatory Visit (INDEPENDENT_AMBULATORY_CARE_PROVIDER_SITE_OTHER): Payer: Medicare Other | Admitting: Internal Medicine

## 2017-06-10 ENCOUNTER — Other Ambulatory Visit: Payer: Self-pay

## 2017-06-10 ENCOUNTER — Encounter: Payer: Self-pay | Admitting: Internal Medicine

## 2017-06-10 VITALS — BP 136/78 | HR 69 | Temp 97.8°F | Ht 70.0 in | Wt 169.0 lb

## 2017-06-10 DIAGNOSIS — Z1159 Encounter for screening for other viral diseases: Secondary | ICD-10-CM | POA: Diagnosis not present

## 2017-06-10 DIAGNOSIS — R899 Unspecified abnormal finding in specimens from other organs, systems and tissues: Secondary | ICD-10-CM | POA: Diagnosis not present

## 2017-06-10 DIAGNOSIS — Z87891 Personal history of nicotine dependence: Secondary | ICD-10-CM

## 2017-06-10 DIAGNOSIS — Z122 Encounter for screening for malignant neoplasm of respiratory organs: Secondary | ICD-10-CM

## 2017-06-10 DIAGNOSIS — Z125 Encounter for screening for malignant neoplasm of prostate: Secondary | ICD-10-CM

## 2017-06-10 DIAGNOSIS — Z1211 Encounter for screening for malignant neoplasm of colon: Secondary | ICD-10-CM

## 2017-06-10 DIAGNOSIS — Z8042 Family history of malignant neoplasm of prostate: Secondary | ICD-10-CM | POA: Diagnosis not present

## 2017-06-10 DIAGNOSIS — E785 Hyperlipidemia, unspecified: Secondary | ICD-10-CM | POA: Diagnosis not present

## 2017-06-10 DIAGNOSIS — J302 Other seasonal allergic rhinitis: Secondary | ICD-10-CM | POA: Diagnosis not present

## 2017-06-10 DIAGNOSIS — Z23 Encounter for immunization: Secondary | ICD-10-CM | POA: Diagnosis present

## 2017-06-10 DIAGNOSIS — Z0000 Encounter for general adult medical examination without abnormal findings: Secondary | ICD-10-CM | POA: Diagnosis not present

## 2017-06-10 MED ORDER — VARENICLINE TARTRATE 0.5 MG X 11 & 1 MG X 42 PO MISC: ORAL | 0 refills | Status: DC

## 2017-06-10 MED ORDER — CETIRIZINE HCL 10 MG PO TABS: 10.0000 mg | ORAL_TABLET | Freq: Every day | ORAL | 0 refills | Status: DC

## 2017-06-10 MED ORDER — FLUTICASONE PROPIONATE 50 MCG/ACT NA SUSP: 2.0000 | Freq: Every day | NASAL | 0 refills | Status: DC

## 2017-06-10 MED ORDER — ATORVASTATIN CALCIUM 80 MG PO TABS: 80.0000 mg | ORAL_TABLET | Freq: Every day | ORAL | 1 refills | Status: DC

## 2017-06-10 MED ORDER — TETANUS-DIPHTH-ACELL PERTUSSIS 5-2-15.5 LF-MCG/0.5 IM SUSP: 0.5000 mL | Freq: Once | INTRAMUSCULAR | 0 refills | Status: AC

## 2017-06-10 NOTE — Patient Instructions (Addendum)
1) please start taking Lipitor again for cholesterol  2) we will get some blood work  3) we prescribed chantix for smoking cessation. Start 1 week before quit date. Follow the directions. FOllow up in about 4 weeks 4) I sent Tdap to the pharmacy  5) I sent in Zyrtec 1 tab nightly and Flonase 2 sprays per each nare for allergies.  6) I made a referral to the GI doctor for colonoscopy  7) I ordered a CT lung test for lung cancer screening   Follow up in 4 weeks

## 2017-06-10 NOTE — Progress Notes (Signed)
   [facility] Phone: [phone]   Date of Visit: 06/10/2017   HPI:    [facility]           Phone: [phone]  Date of Visit: 06/10/2017   HPI:  Patient presents today for a well adult male exam.   Concerns today:  Cough: -Reports of dry cough for the past 2 to 3 months.  Most noticeable at night.  Coughs mainly when his throat is dry.  No shortness of breath or wheezing, no paroxysmal nocturnal dyspnea, no rhinorrhea but has intermittent congestion.  Has watery and itchy eyes sneezing no sore throat.  No fevers or chills. Sexual activity: Sexually active with wife STD Screening: Declines STD screening Exercise: walk 2-3 miles a day  Diet: Financial difficulties limit purchasing of certain healthy foods Smoking: Current smoker with 2-1/2 packs/day of smoking since the age of 1.  Has tried to quit in the past with nicotine patches and gum without much success.  He is interested in quitting again.  He is interested in other methods to help him quit. Alcohol: none Drugs: none Mood: PHQ 2 negative  ROS: See HPI  PMFSH:  Cancers in family: Prostate and colon cancer -father (uncertain when he was diagnosed)  PHYSICAL EXAM: BP 136/78   Pulse 69   Temp 97.8 F (36.6 C) (Oral)   Ht  (1.778 m)   Wt 169 lb (76.7 kg)   SpO2 98%   BMI 24.25 kg/m  Gen: NAD, pleasant, cooperative HEENT: NCAT, PERRL, no palpable thyromegaly or anterior cervical lymphadenopathy, oropharynx without any erythema or exudates Heart: RRR, no murmurs Lungs: CTAB, NWOB Abdomen: soft, nontender to palpation Neuro: grossly nonfocal, speech normal  ASSESSMENT/PLAN:  # Health maintenance:  -STD screening: Patient declines -immunizations: Tdap sent to pharmacy -lipid screening: Ordered as a future order and patient to come in fasting for lab visit.  Patient is not taking atorvastatin as recommended at his past visit.  He is going to try again.  refill sent. -colonoscopy: Referral to GI made -Hepatitis C screening today -Low-dose CT lung ordered for lung cancer screening -Is interested in prostate cancer screening as he has a family history of prostate cancer.  However, I am unable to order this as his insurance is not accepted diagnosis I have chosen.  We will have to ask the lab about this first  Tobacco cessation: Patient is interested in quitting and is interested in Chantix.  Discussed benefits and side effects of medication.  Chantix starter pack prescribed.  Follow-up in 4 weeks to see how things are going with medication. he can always call if he is having difficulty with his medication.  Seasonal allergies: His cough symptoms are most consistent with seasonal allergies.  Lungs are clear to auscultation.  Unlikely infectious process or COPD exacerbation.  Will start Zyrtec 10 mg daily and Flonase nasal spray.   Palma Holter, MD PGY 3   [facility]

## 2017-06-11 LAB — LIPID PANEL
CHOLESTEROL TOTAL: 231 mg/dL — AB (ref 100–199)
Chol/HDL Ratio: 5.1 ratio — ABNORMAL HIGH (ref 0.0–5.0)
HDL: 45 mg/dL (ref >39)
LDL CALC: 164 mg/dL — AB (ref 0–99)
TRIGLYCERIDES: 110 mg/dL (ref 0–149)
VLDL Cholesterol Cal: 22 mg/dL (ref 5–40)

## 2017-06-11 LAB — CMP14+EGFR
ALT: 7 IU/L (ref 0–44)
AST: 11 IU/L (ref 0–40)
Albumin/Globulin Ratio: 1.8 (ref 1.2–2.2)
Albumin: 4.2 g/dL (ref 3.6–4.8)
Alkaline Phosphatase: 92 IU/L (ref 39–117)
BUN/Creatinine Ratio: 5 — ABNORMAL LOW (ref 10–24)
BUN: 4 mg/dL — AB (ref 8–27)
Bilirubin Total: 0.6 mg/dL (ref 0.0–1.2)
CALCIUM: 9.9 mg/dL (ref 8.6–10.2)
CO2: 23 mmol/L (ref 20–29)
Chloride: 104 mmol/L (ref 96–106)
Creatinine, Ser: 0.84 mg/dL (ref 0.76–1.27)
GFR calc Af Amer: 104 mL/min/{1.73_m2} (ref >59)
GFR, EST NON AFRICAN AMERICAN: 90 mL/min/{1.73_m2} (ref >59)
GLUCOSE: 104 mg/dL — AB (ref 65–99)
Globulin, Total: 2.4 g/dL (ref 1.5–4.5)
Potassium: 4.8 mmol/L (ref 3.5–5.2)
Sodium: 142 mmol/L (ref 134–144)
Total Protein: 6.6 g/dL (ref 6.0–8.5)

## 2017-06-11 LAB — HEPATITIS C ANTIBODY: Hep C Virus Ab: <0.1 s/co ratio (ref 0.0–0.9)

## 2017-06-12 ENCOUNTER — Encounter: Payer: Self-pay | Admitting: Internal Medicine

## 2017-06-19 ENCOUNTER — Ambulatory Visit (HOSPITAL_COMMUNITY): Payer: Medicare Other | Attending: Family Medicine

## 2017-07-09 ENCOUNTER — Ambulatory Visit: Payer: Medicare Other | Admitting: Internal Medicine

## 2017-11-14 ENCOUNTER — Ambulatory Visit (INDEPENDENT_AMBULATORY_CARE_PROVIDER_SITE_OTHER): Payer: Medicare Other | Admitting: *Deleted

## 2017-11-14 DIAGNOSIS — Z23 Encounter for immunization: Secondary | ICD-10-CM | POA: Diagnosis not present

## 2018-04-08 ENCOUNTER — Other Ambulatory Visit: Payer: Self-pay

## 2018-04-08 ENCOUNTER — Emergency Department (HOSPITAL_COMMUNITY): Payer: Medicare Other

## 2018-04-08 ENCOUNTER — Emergency Department (HOSPITAL_COMMUNITY)
Admission: EM | Admit: 2018-04-08 | Discharge: 2018-04-08 | Disposition: A | Discharge status: Home / Self Care | Payer: Medicare Other | Attending: Emergency Medicine | Admitting: Emergency Medicine

## 2018-04-08 ENCOUNTER — Encounter (HOSPITAL_COMMUNITY): Payer: Self-pay

## 2018-04-08 DIAGNOSIS — Z87891 Personal history of nicotine dependence: Secondary | ICD-10-CM | POA: Insufficient documentation

## 2018-04-08 DIAGNOSIS — I10 Essential (primary) hypertension: Secondary | ICD-10-CM | POA: Insufficient documentation

## 2018-04-08 DIAGNOSIS — J111 Influenza due to unidentified influenza virus with other respiratory manifestations: Secondary | ICD-10-CM | POA: Insufficient documentation

## 2018-04-08 DIAGNOSIS — R05 Cough: Secondary | ICD-10-CM | POA: Diagnosis present

## 2018-04-08 DIAGNOSIS — R509 Fever, unspecified: Secondary | ICD-10-CM | POA: Diagnosis not present

## 2018-04-08 LAB — COMPREHENSIVE METABOLIC PANEL
ALT: 9 U/L (ref 0–44)
AST: 12 U/L — ABNORMAL LOW (ref 15–41)
Albumin: 3.7 g/dL (ref 3.5–5.0)
Alkaline Phosphatase: 73 U/L (ref 38–126)
Anion gap: 8 (ref 5–15)
BILIRUBIN TOTAL: 1.3 mg/dL — AB (ref 0.3–1.2)
BUN: 10 mg/dL (ref 8–23)
CALCIUM: 8.8 mg/dL — AB (ref 8.9–10.3)
CHLORIDE: 105 mmol/L (ref 98–111)
CO2: 22 mmol/L (ref 22–32)
CREATININE: 0.98 mg/dL (ref 0.61–1.24)
Glucose, Bld: 105 mg/dL — ABNORMAL HIGH (ref 70–99)
Potassium: 3.5 mmol/L (ref 3.5–5.1)
Sodium: 135 mmol/L (ref 135–145)
TOTAL PROTEIN: 6.8 g/dL (ref 6.5–8.1)

## 2018-04-08 LAB — CBC WITH DIFFERENTIAL/PLATELET
ABS IMMATURE GRANULOCYTES: 0.02 10*3/uL (ref 0.00–0.07)
Basophils Absolute: 0 10*3/uL (ref 0.0–0.1)
Basophils Relative: 0 %
EOS ABS: 0 10*3/uL (ref 0.0–0.5)
Eosinophils Relative: 0 %
HCT: 46.1 % (ref 39.0–52.0)
Hemoglobin: 15.1 g/dL (ref 13.0–17.0)
Immature Granulocytes: 0 %
LYMPHS PCT: 9 %
Lymphs Abs: 0.6 10*3/uL — ABNORMAL LOW (ref 0.7–4.0)
MCH: 29.1 pg (ref 26.0–34.0)
MCHC: 32.8 g/dL (ref 30.0–36.0)
MCV: 88.8 fL (ref 80.0–100.0)
MONO ABS: 0.9 10*3/uL (ref 0.1–1.0)
MONOS PCT: 13 %
Neutro Abs: 5.6 10*3/uL (ref 1.7–7.7)
Neutrophils Relative %: 78 %
PLATELETS: 231 10*3/uL (ref 150–400)
RBC: 5.19 MIL/uL (ref 4.22–5.81)
RDW: 14 % (ref 11.5–15.5)
WBC: 7.3 10*3/uL (ref 4.0–10.5)
nRBC: 0 % (ref 0.0–0.2)

## 2018-04-08 LAB — INFLUENZA PANEL BY PCR (TYPE A & B)
INFLAPCR: POSITIVE — AB
INFLBPCR: NEGATIVE

## 2018-04-08 MED ORDER — ACETAMINOPHEN 325 MG PO TABS
650.0000 mg | ORAL_TABLET | Freq: Once | ORAL | Status: AC | PRN
  Administered 2018-04-08: 650 mg via ORAL
  Filled 2018-04-08: qty 2

## 2018-04-08 MED ORDER — SODIUM CHLORIDE 0.9 % IV BOLUS
1000.0000 mL | Freq: Once | INTRAVENOUS | Status: AC
  Administered 2018-04-08: 1000 mL via INTRAVENOUS

## 2018-04-08 MED ORDER — BENZONATATE 100 MG PO CAPS: 200.0000 mg | ORAL_CAPSULE | Freq: Three times a day (TID) | ORAL | 0 refills | Status: DC | PRN

## 2018-04-08 MED ORDER — ONDANSETRON HCL 4 MG/2ML IJ SOLN
4.0000 mg | Freq: Once | INTRAMUSCULAR | Status: AC
  Administered 2018-04-08: 4 mg via INTRAVENOUS
  Filled 2018-04-08: qty 2

## 2018-04-08 MED ORDER — ONDANSETRON 4 MG PO TBDP: ORAL_TABLET | ORAL | 0 refills | Status: DC

## 2018-04-08 MED ORDER — OSELTAMIVIR PHOSPHATE 75 MG PO CAPS: 75.0000 mg | ORAL_CAPSULE | Freq: Two times a day (BID) | ORAL | 0 refills | Status: DC

## 2018-04-08 NOTE — ED Notes (Signed)
Pt requesting to be discharged. MD made aware.  

## 2018-04-08 NOTE — ED Notes (Signed)
Pt placed on droplet precautions at this time  

## 2018-04-08 NOTE — ED Notes (Signed)
Pt ambulated to the restroom with out difficulty.

## 2018-04-08 NOTE — ED Triage Notes (Signed)
Patient c/o a non productive cough since yesterday and states he was coughing so hard it made him throw up. Patient had a fever in triage-101.3.

## 2018-04-08 NOTE — Discharge Instructions (Addendum)
Drink plenty of fluids and follow-up with your doctor if not improving.  Tylenol for fever

## 2018-04-08 NOTE — ED Provider Notes (Signed)
[facility]-EMERGENCY DEPT Provider Note   CSN: [id number] Arrival date & time: 04/08/18  4098    History   Chief Complaint Chief Complaint  Patient presents with  . Cough  . Fever    HPI [name] is a 68 y.o. male.     Patient complains of cough and fever and vomiting.  This started yesterday  The history is provided by the patient. No language interpreter was used.  Cough  Cough characteristics:  Dry and non-productive Sputum characteristics:  Nondescript Severity:  Mild Onset quality:  Sudden Timing:  Constant Progression:  Worsening Chronicity:  New Smoker: no   Context: not animal exposure   Relieved by:  Nothing Worsened by:  Nothing Associated symptoms: fever   Associated symptoms: no chest pain, no eye discharge, no headaches and no rash   Fever  Associated symptoms: cough   Associated symptoms: no chest pain, no congestion, no diarrhea, no headaches and no rash     Past Medical History:  Diagnosis Date  . Cataract   . GERD (gastroesophageal reflux disease)   . Hypertension     Patient Active Problem List   Diagnosis Date Noted  . Cigarette nicotine dependence without complication 05/05/2015  . Preventative health care 04/05/2015  . GERD 08/23/2008  . ERECTILE DYSFUNCTION 10/16/2006  . HYPERCHOLESTEROLEMIA 04/10/2006  . HYPERTENSION, BENIGN SYSTEMIC 04/10/2006  . Back pain 04/10/2006    Past Surgical History:  Procedure Laterality Date  . EYE SURGERY     cataract        Home Medications    Prior to Admission medications   Medication Sig Start Date End Date Taking? Authorizing Provider  atorvastatin (LIPITOR) 80 MG tablet Take 1 tablet (80 mg total) by mouth at bedtime. Patient not taking: Reported on 04/08/2018 06/10/17   Palma Holter, MD  benzonatate (TESSALON) 100 MG capsule Take 2 capsules (200 mg total) by mouth 3 (three) times daily as needed for cough. 04/08/18   Bethann Berkshire, MD  cetirizine  (ZYRTEC) 10 MG tablet Take 1 tablet (10 mg total) by mouth at bedtime. Patient not taking: Reported on 04/08/2018 06/10/17   Palma Holter, MD  fluticasone [facility]) 50 MCG/ACT nasal spray Place 2 sprays into both nostrils daily. Patient not taking: Reported on 04/08/2018 06/10/17   Palma Holter, MD  ondansetron (ZOFRAN ODT) 4 MG disintegrating tablet  ODT q4 hours prn nausea/vomit 04/08/18   Bethann Berkshire, MD  oseltamivir (TAMIFLU) 75 MG capsule Take 1 capsule (75 mg total) by mouth every 12 (twelve) hours. 04/08/18   Bethann Berkshire, MD  varenicline (CHANTIX PAK) 0.5 MG X 11 & 1 MG X 42 tablet Take one 0.5 mg tablet by mouth once daily for 3 days, then increase to one 0.5 mg tablet twice daily for 4 days, then increase to one 1 mg tablet twice daily. Patient not taking: Reported on 04/08/2018 06/10/17   Palma Holter, MD    Family History Family History  Problem Relation Age of Onset  . Heart disease Mother        4s  . Hypertension Mother   . Diabetes Mother   . Cancer Father        colon and prostate  . Diabetes Sister   . Heart disease Sister 20       has pacemaker     Social History Social History   Tobacco Use  . Smoking status: Former Smoker    Packs/day: 2.00  Years: 50.00    Pack years: 100.00    Types: Cigarettes    Last attempt to quit: 08/19/2015    Years since quitting: 2.6  . Smokeless tobacco: Never Used  Substance Use Topics  . Alcohol use: No  . Drug use: No     Allergies   Patient has no known allergies.   Review of Systems Review of Systems  Constitutional: Positive for fever. Negative for appetite change and fatigue.  HENT: Negative for congestion, ear discharge and sinus pressure.   Eyes: Negative for discharge.  Respiratory: Positive for cough.   Cardiovascular: Negative for chest pain.  Gastrointestinal: Negative for abdominal pain and diarrhea.  Genitourinary: Negative for frequency and hematuria.  Musculoskeletal:  Negative for back pain.  Skin: Negative for rash.  Neurological: Negative for seizures and headaches.  Psychiatric/Behavioral: Negative for hallucinations.     Physical Exam Updated Vital Signs BP 134/70   Pulse 78   Temp 99.5 F (37.5 C) (Oral)   Resp (!) 32   Ht 5\' 7"  (1.702 m)   Wt 72.6 kg   SpO2 97%   BMI 25.06 kg/m   Physical Exam Vitals signs and nursing note reviewed.  Constitutional:      Appearance: He is well-developed.  HENT:     Head: Normocephalic.     Nose: Nose normal.  Eyes:     General: No scleral icterus.    Conjunctiva/sclera: Conjunctivae normal.  Neck:     Musculoskeletal: Neck supple.     Thyroid: No thyromegaly.  Cardiovascular:     Rate and Rhythm: Normal rate and regular rhythm.     Heart sounds: No murmur. No friction rub. No gallop.   Pulmonary:     Breath sounds: No stridor. No wheezing or rales.  Chest:     Chest wall: No tenderness.  Abdominal:     General: There is no distension.     Tenderness: There is no abdominal tenderness. There is no rebound.  Musculoskeletal: Normal range of motion.  Lymphadenopathy:     Cervical: No cervical adenopathy.  Skin:    Findings: No erythema or rash.  Neurological:     Mental Status: He is oriented to person, place, and time.     Motor: No abnormal muscle tone.     Coordination: Coordination normal.  Psychiatric:        Behavior: Behavior normal.      ED Treatments / Results  Labs (all labs ordered are listed, but only abnormal results are displayed) Labs Reviewed  CBC WITH DIFFERENTIAL/PLATELET - Abnormal; Notable for the following components:      Result Value   Lymphs Abs 0.6 (*)    All other components within normal limits  COMPREHENSIVE METABOLIC PANEL - Abnormal; Notable for the following components:   Glucose, Bld 105 (*)    Calcium 8.8 (*)    AST 12 (*)    Total Bilirubin 1.3 (*)    All other components within normal limits  INFLUENZA PANEL BY PCR (TYPE A & B) - Abnormal;  Notable for the following components:   Influenza A By PCR POSITIVE (*)    All other components within normal limits    EKG None  Radiology Dg Abd Acute 2+v W 1v Chest  Result Date: 04/08/2018 CLINICAL DATA:  Nausea and vomiting EXAM: DG ABDOMEN ACUTE W/ 1V CHEST COMPARISON:  06/04/2014 FINDINGS: Heart size and vascularity normal. Lungs are clear without infiltrate or effusion. Negative for heart failure. Normal bowel gas  pattern. No bowel obstruction or ileus. Negative for free air. No acute skeletal abnormality. Negative for urinary tract calculi. IMPRESSION: No acute cardiopulmonary disease Normal bowel gas pattern. Electronically Signed   By: Marlan Palau M.D.   On: 04/08/2018 12:11    Procedures Procedures (including critical care time)  Medications Ordered in ED Medications  acetaminophen (TYLENOL) tablet 650 mg (650 mg Oral Given 04/08/18 0940)  sodium chloride 0.9 % bolus 1,000 mL (1,000 mLs Intravenous Bolus 04/08/18 1115)  ondansetron (ZOFRAN) injection 4 mg (4 mg Intravenous Given 04/08/18 1115)     Initial Impression / Assessment and Plan / ED Course  I have reviewed the triage vital signs and the nursing notes.  Pertinent labs & imaging results that were available during my care of the patient were reviewed by me and considered in my medical decision making (see chart for details).     Patient with influenza.  He was placed on Tamiflu Tessalon Perles and Zofran and will follow-up as needed  Final Clinical Impressions(s) / ED Diagnoses   Final diagnoses:  Influenza    ED Discharge Orders         Ordered    oseltamivir (TAMIFLU) 75 MG capsule  Every 12 hours     04/08/18 1344    benzonatate (TESSALON) 100 MG capsule  3 times daily PRN     04/08/18 1344    ondansetron (ZOFRAN ODT) 4 MG disintegrating tablet     04/08/18 1344           Bethann Berkshire, MD 04/08/18 1354

## 2018-04-16 ENCOUNTER — Ambulatory Visit (INDEPENDENT_AMBULATORY_CARE_PROVIDER_SITE_OTHER): Payer: Medicare Other | Admitting: Family Medicine

## 2018-04-16 ENCOUNTER — Encounter: Payer: Self-pay | Admitting: Family Medicine

## 2018-04-16 VITALS — BP 118/62 | HR 95 | Temp 98.6°F | Wt 165.1 lb

## 2018-04-16 DIAGNOSIS — J101 Influenza due to other identified influenza virus with other respiratory manifestations: Secondary | ICD-10-CM

## 2018-04-16 DIAGNOSIS — R05 Cough: Secondary | ICD-10-CM | POA: Insufficient documentation

## 2018-04-16 DIAGNOSIS — R059 Cough, unspecified: Secondary | ICD-10-CM | POA: Insufficient documentation

## 2018-04-16 HISTORY — DX: Influenza due to other identified influenza virus with other respiratory manifestations: J10.1

## 2018-04-16 MED ORDER — BENZONATATE 100 MG PO CAPS: 200.0000 mg | ORAL_CAPSULE | Freq: Three times a day (TID) | ORAL | 0 refills | Status: DC | PRN

## 2018-04-16 NOTE — Assessment & Plan Note (Signed)
Patient follow up for recent diagnosis for Influenza A on 04/08/2018. Patient is much improved, still complaining of cough but denies any SOB, fever or chills. Exam was unremarkable. Will prescribe tessalon perles. Patient will make a new appointment with me (PCP) for annual physical.

## 2018-04-16 NOTE — Progress Notes (Signed)
   Subjective:    Patient ID: [name], male    DOB: September 19, 1950, 68 y.o.   MRN: [id number]   CC: Follow up for flu  HPI: Patient is a 68 yo male who presents today to follow up on recent diagnosis for flu. Patient reports that he has completed his Tamiflu course and and Tessalon Perles. Patient reports that breathing and fatigue has significantly improve since ED visit. Patient denies any fevers or shortness of breath but reports that cough was better with tessalon perles. Patient denies any chest pain, nausea, vomiting, abdominal pain, dizziness, headaches. Patient continue to smoke 1/2 pack a day. Appetite is slowly returning to baseline.  Smoking status reviewed   ROS: all other systems were reviewed and are negative other than in the HPI   Past Medical History:  Diagnosis Date  . Cataract   . GERD (gastroesophageal reflux disease)   . Hypertension     Past Surgical History:  Procedure Laterality Date  . EYE SURGERY     cataract    Past medical history, surgical, family, and social history reviewed and updated in the EMR as appropriate.  Objective:  BP 118/62   Pulse 95   Temp 98.6 F (37 C) (Oral)   Wt 165 lb 2 oz (74.9 kg)   SpO2 96%   BMI 25.86 kg/m   Vitals and nursing note reviewed  General: NAD, pleasant, able to participate in exam Cardiac: RRR, normal heart sounds, no murmurs. 2+ radial and PT pulses bilaterally Respiratory: CTAB, normal effort, No wheezes, rales or rhonchi Abdomen: soft, nontender, nondistended, no hepatic or splenomegaly, +BS Extremities: no edema or cyanosis. WWP. Skin: warm and dry, no rashes noted Neuro: alert and oriented x4, no focal deficits Psych: Normal affect and mood   Assessment & Plan:   Influenza A Patient follow up for recent diagnosis for Influenza A on 04/08/2018. Patient is much improved, still complaining of cough but denies any SOB, fever or chills. Exam was unremarkable. Will prescribe tessalon perles.  Patient will make a new appointment with me (PCP) for annual physical.     Lovena Neighbours, MD [facility] PGY-3

## 2018-04-16 NOTE — Patient Instructions (Addendum)
It was great seeing you today! We have addressed the following issues today  1. You are improving from the flu, the cough will linger for a while. I sent the cough capsules to your pharmacy.  2. Make sure to make an appointment to see me in a few weeks for a regular check up.  If we did any lab work today, and the results require attention, either me or my nurse will get in touch with you. If everything is normal, you will get a letter in mail and a message via . If you don't hear from [place] in two weeks, please give [place] a call. Otherwise, we look forward to seeing you again at your next visit. If you have any questions or concerns before then, please call the clinic at [phone].  Please bring all your medications to every doctors visit  Sign up for My Chart to have easy access to your labs results, and communication with your Primary care physician. Please ask Front Desk for some assistance.   Please check-out at the front desk before leaving the clinic.    Take Care,   Dr. Sydnee Cabal

## 2018-05-01 ENCOUNTER — Other Ambulatory Visit: Payer: Self-pay

## 2018-05-01 ENCOUNTER — Ambulatory Visit (INDEPENDENT_AMBULATORY_CARE_PROVIDER_SITE_OTHER): Payer: Medicare Other | Admitting: Family Medicine

## 2018-05-01 ENCOUNTER — Encounter: Payer: Self-pay | Admitting: Family Medicine

## 2018-05-01 VITALS — BP 110/60 | HR 93 | Temp 98.9°F | Wt 163.4 lb

## 2018-05-01 DIAGNOSIS — Z122 Encounter for screening for malignant neoplasm of respiratory organs: Secondary | ICD-10-CM | POA: Diagnosis not present

## 2018-05-01 DIAGNOSIS — Z1211 Encounter for screening for malignant neoplasm of colon: Secondary | ICD-10-CM

## 2018-05-01 DIAGNOSIS — I10 Essential (primary) hypertension: Secondary | ICD-10-CM

## 2018-05-01 DIAGNOSIS — E7800 Pure hypercholesterolemia, unspecified: Secondary | ICD-10-CM | POA: Diagnosis not present

## 2018-05-01 MED ORDER — CETIRIZINE HCL 10 MG PO TABS: 10.0000 mg | ORAL_TABLET | Freq: Every day | ORAL | 0 refills | Status: DC

## 2018-05-01 MED ORDER — FLUTICASONE PROPIONATE 50 MCG/ACT NA SUSP: 2.0000 | Freq: Every day | NASAL | 0 refills | Status: DC

## 2018-05-01 NOTE — Progress Notes (Signed)
SUBJECTIVE:  [name] is a 68 y.o. male presenting for his annual checkup. Patient was recently seen in clinic for flu follow-up.  Patient at that time was feeling much improved but was seen endorsing mild cough.  Patient was prescribed Tessalon Perles and reported it helped significantly.  Patient is here today for his annual checkup.  Patient has no acute complaints today.  Patient reports that he still smoking half a pack a day.  This is significantly down from 2-1/2 years ago.  Patient is trying to cut down.  He has not taken any medication in the past few months.  Patient reports that he does have Chantix at home would be willing to restart.  Reports that he walks every day about 2 to 3 miles.  Patient does not drink any alcohol or use any drugs.  Patient is retired and is sexually active with his wife.  Patient did not have colonoscopy last year due to trip though schedule since then.  Patient also did not have his low-dose CT for lung cancer screening.  Current Outpatient Medications  Medication Sig Dispense Refill  . atorvastatin (LIPITOR) 80 MG tablet Take 1 tablet (80 mg total) by mouth at bedtime. (Patient not taking: Reported on 04/08/2018) 30 tablet 1  . benzonatate (TESSALON) 100 MG capsule Take 2 capsules (200 mg total) by mouth 3 (three) times daily as needed for cough. 20 capsule 0  . cetirizine (ZYRTEC) 10 MG tablet Take 1 tablet (10 mg total) by mouth at bedtime. 30 tablet 0  . fluticasone (FLONASE) 50 MCG/ACT nasal spray Place 2 sprays into both nostrils daily. 16 g 0  . ondansetron (ZOFRAN ODT) 4 MG disintegrating tablet 4mg  ODT q4 hours prn nausea/vomit 12 tablet 0  . oseltamivir (TAMIFLU) 75 MG capsule Take 1 capsule (75 mg total) by mouth every 12 (twelve) hours. 10 capsule 0  . varenicline (CHANTIX PAK) 0.5 MG X 11 & 1 MG X 42 tablet Take one 0.5 mg tablet by mouth once daily for 3 days, then increase to one 0.5 mg tablet twice daily for 4 days, then increase to one 1 mg  tablet twice daily. (Patient not taking: Reported on 04/08/2018) 53 tablet 0   No current facility-administered medications for this visit.     Allergies: Patient has no known allergies.   ROS:  Feeling well. No dyspnea or chest pain on exertion. No abdominal pain, change in bowel habits, black or bloody stools. No urinary tract or prostatic symptoms. No neurological complaints.  Past Medical History:  Diagnosis Date  . Cataract   . GERD (gastroesophageal reflux disease)   . Hypertension     @SURHX @  OBJECTIVE:  BP 110/60   Pulse 93   Temp 98.9 F (37.2 C) (Oral)   Wt 163 lb 6 oz (74.1 kg)   SpO2 93%   BMI 25.59 kg/m   Physical Exam: HEENT: normal.  Neck supple. No adenopathy or thyromegaly.  PERLA. Respiratory: Lungs are clear, good air entry, no wheezes, rhonchi or rales.  Cardio:S1 and S2 normal, no murmurs, regular rate and rhythm. Abdominal: Abdomen is soft without tenderness, guarding, mass or organomegaly. GU: no penile lesions or discharge, no testicular masses or tenderness, no hernias. Extremities: show no edema, normal peripheral pulses. Neuro:Neurological is normal without focal findings.  ASSESSMENT:  healthy adult male with no acute complaints today.  PLAN:  Hypertension Blood pressure today 110/60.  Patient is currently on no oral agent.  He denies any symptoms such  as dizzines.  Recommended increase fluid intake. --Follow-up on CMP  Tobacco abuse Patient has cut down significantly from 2-1/2 pack a day to half a pack a day.  Encourage patient to continue cutting down.  Recommended resuming Chantix if willing.  Will readdress at next office visit.  Patient does not have a quit date but is definitely motivated. --We will order low-dose CT scan for screening.  Patient has been scheduled.  Seasonal allergy Patient has not been on cetirizine or Flonase in the past 3 months.  He did recently influenza with significant cough.  Cough has improved with  Tessalon Perles.  However given history of allergies, will resume cetirizine and Flonase.  We will continue to monitor symptoms.  Hyperlipidemia It appears is not taking Lipitor 80 mg.  ASCVD ten-year risk is 15.8%.  Given hypertension and last lipid panel patient likely should be on statin therapy.  We will repeat lipid panel today and decide if patient is to be on 40 mg or 80 mg atorvastatin.  Patient does not have any diabetes and blood pressure seems well controlled without any elevation.  Patient did have elevated bili 1 month ago.  We will repeat LFTs. --Follow-up on CMP  Health maintenance Ambulatory referral to GI placed for colonoscopy. --Order A1c for diabetes screening.      Lovena Neighbours, MD Family Medicine, PGY-2

## 2018-05-02 LAB — LIPID PANEL
CHOL/HDL RATIO: 5.9 ratio — AB (ref 0.0–5.0)
CHOLESTEROL TOTAL: 230 mg/dL — AB (ref 100–199)
HDL: 39 mg/dL — ABNORMAL LOW (ref >39)
LDL Calculated: 173 mg/dL — ABNORMAL HIGH (ref 0–99)
Triglycerides: 91 mg/dL (ref 0–149)
VLDL Cholesterol Cal: 18 mg/dL (ref 5–40)

## 2018-05-02 LAB — COMPREHENSIVE METABOLIC PANEL
ALBUMIN: 4.3 g/dL (ref 3.8–4.8)
ALT: 8 IU/L (ref 0–44)
AST: 12 IU/L (ref 0–40)
Albumin/Globulin Ratio: 1.7 (ref 1.2–2.2)
Alkaline Phosphatase: 103 IU/L (ref 39–117)
BUN / CREAT RATIO: 7 — AB (ref 10–24)
BUN: 6 mg/dL — ABNORMAL LOW (ref 8–27)
Bilirubin Total: 0.9 mg/dL (ref 0.0–1.2)
CALCIUM: 9.5 mg/dL (ref 8.6–10.2)
CO2: 20 mmol/L (ref 20–29)
Chloride: 104 mmol/L (ref 96–106)
Creatinine, Ser: 0.89 mg/dL (ref 0.76–1.27)
GFR, EST AFRICAN AMERICAN: 101 mL/min/{1.73_m2} (ref >59)
GFR, EST NON AFRICAN AMERICAN: 87 mL/min/{1.73_m2} (ref >59)
GLOBULIN, TOTAL: 2.5 g/dL (ref 1.5–4.5)
Glucose: 108 mg/dL — ABNORMAL HIGH (ref 65–99)
Potassium: 4.1 mmol/L (ref 3.5–5.2)
Sodium: 144 mmol/L (ref 134–144)
TOTAL PROTEIN: 6.8 g/dL (ref 6.0–8.5)

## 2018-05-14 ENCOUNTER — Ambulatory Visit: Payer: Medicare Other

## 2018-07-01 ENCOUNTER — Ambulatory Visit: Payer: Medicare Other

## 2018-07-03 ENCOUNTER — Ambulatory Visit: Payer: Medicare Other

## 2018-07-14 ENCOUNTER — Ambulatory Visit: Payer: Medicare Other

## 2018-11-16 ENCOUNTER — Other Ambulatory Visit: Payer: Self-pay

## 2018-11-16 ENCOUNTER — Ambulatory Visit (INDEPENDENT_AMBULATORY_CARE_PROVIDER_SITE_OTHER): Payer: Medicare Other | Admitting: *Deleted

## 2018-11-16 DIAGNOSIS — Z23 Encounter for immunization: Secondary | ICD-10-CM

## 2019-03-01 ENCOUNTER — Encounter: Payer: Self-pay | Admitting: Family Medicine

## 2019-03-01 ENCOUNTER — Ambulatory Visit (INDEPENDENT_AMBULATORY_CARE_PROVIDER_SITE_OTHER): Payer: 59 | Admitting: Family Medicine

## 2019-03-01 ENCOUNTER — Other Ambulatory Visit: Payer: Self-pay

## 2019-03-01 VITALS — BP 126/58 | HR 101 | Ht 70.0 in | Wt 173.1 lb

## 2019-03-01 DIAGNOSIS — M545 Low back pain, unspecified: Secondary | ICD-10-CM | POA: Insufficient documentation

## 2019-03-01 DIAGNOSIS — Z23 Encounter for immunization: Secondary | ICD-10-CM

## 2019-03-01 DIAGNOSIS — F17210 Nicotine dependence, cigarettes, uncomplicated: Secondary | ICD-10-CM | POA: Diagnosis not present

## 2019-03-01 NOTE — Progress Notes (Signed)
   Subjective:    Patient ID: [name], male    DOB: 10-05-1950, 69 y.o.   MRN: [id number]   CC: Back and side pain  HPI: [name] is a 69 year old male with PMH of HTN, HLD, Back pain, and nicotine dependence.  Constipation: patient reports one or two hard stools daily. To make himself stool he has been drinking coffee in the morning and mixing Miralax in with the coffee. He finds himself straining and it sometimes hurts to stool. Stools are otherwise normal in shape and appearance, denies pitch black stools or blood in stools. Other than having to strain they are not painful.  Back Pain: he has been experiencing right-sided back pain. The pain stays in the same location and does not radiate. It is a feeling of tightness and stiffness in the area. He denies any radiating pain, denies any involvement of his lower leg or foot. He has been walking to relieve the pain. He takes tylenol occasionally.   Smoking: the patient reports he is still smoking. He used to smoke 2 packs daily and has cut way back to smoking 1/2 ppd. He is interested in quitting, is using a patch occasionally; has smoked since he was 69 years old (47 years!). He is not currently interested in any other meds to help him quit. - Patient is due for low dose CT lung screening - Has never had abdominal aneurysm Ultrasound screening - Had Pneumovax in 2019  Review of Systems - See HPI   Objective:  BP (!) 126/58   Pulse (!) 101   Ht 5\' 10"  (1.778 m)   Wt 173 lb 2 oz (78.5 kg)   SpO2 99%   BMI 24.84 kg/m  Vitals and nursing note reviewed  PHYSICAL EXAM: General: well nourished, in no acute distress Cardiac: RRR, clear S1 and S2, no murmurs appreciated Respiratory: CTA bilaterally, no increased work of breathing, no adventitious sounds appreciated Abdomen: normal bowel sounds Back: hypertonicity and TTP appreciated to right low back at quadratus lumborum  Assessment & Plan:   Acute midline low back  pain without sciatica -Patient to use tylenol 500mg  q6 as needed for pain -Can use heating pad 15 minutes 2-3 times daily to help muscle relax -Given back exercises to be performed at least daily  Need for Tdap vaccination -Patient given Tdap vaccine today  Smoking greater than 40 pack years Patient smoking for 47 years. Has cut back from 2 ppd to 1/2 ppd. - Ordered low dose CT lung screening - Ordered of abdomen to screen for AAA.    Return if symptoms worsen or fail to improve.   Dr. [facility], PGY-2

## 2019-03-01 NOTE — Assessment & Plan Note (Signed)
-  Patient to use tylenol 500mg  q6 as needed for pain -Can use heating pad 15 minutes 2-3 times daily to help muscle relax -Given back exercises to be performed at least daily

## 2019-03-01 NOTE — Assessment & Plan Note (Signed)
-  Patient given Tdap vaccine today 

## 2019-03-01 NOTE — Patient Instructions (Signed)
Thank you for coming in to see [place] today! Please see below to review our plan for today's visit:  1. Drink plenty water daily to help with constipation. Take a full cap of Miralax daily, you can increase or decrease this amount to have at least once soft poop daily. 2. Use heating pad for 15 minutes 2-3 times daily on your back as needed for back tightness. Take Tylenol 500mg  every 6 hours as needed for pain. KEEP WALKING! 3. Go get your Ultrasound and CT Scan as soon as you can!  Please call the clinic at [phone] if your symptoms worsen or you have any concerns. It was our pleasure to serve you!  Dr. [phone] [facility]   Low Back Sprain or Strain Rehab Ask your health care provider which exercises are safe for you. Do exercises exactly as told by your health care provider and adjust them as directed. It is normal to feel mild stretching, pulling, tightness, or discomfort as you do these exercises. Stop right away if you feel sudden pain or your pain gets worse. Do not begin these exercises until told by your health care provider. Stretching and range-of-motion exercises These exercises warm up your muscles and joints and improve the movement and flexibility of your back. These exercises also help to relieve pain, numbness, and tingling.  Lumbar rotation 1. Lie on your back on a firm surface and bend your knees. 2. Straighten your arms out to your sides so each arm forms a 90-degree angle (right angle) with a side of your body. 3. Slowly move (rotate) both of your knees to one side of your body until you feel a stretch in your lower back (lumbar). Try not to let your shoulders lift off the floor. 4. Hold this position for ____15______ seconds. 5. Tense your abdominal muscles and slowly move your knees back to the starting position. 6. Repeat this exercise on the other side of your body. Repeat _____3_____ times. Complete this exercise ____1______ times a  day.  Single knee to chest  1. Lie on your back on a firm surface with both legs straight. 2. Bend one of your knees. Use your hands to move your knee up toward your chest until you feel a gentle stretch in your lower back and buttock. ? Hold your leg in this position by holding on to the front of your knee. ? Keep your other leg as straight as possible. 3. Hold this position for _____15_____ seconds. 4. Slowly return to the starting position. 5. Repeat with your other leg. Repeat ____3______ times. Complete this exercise _____1_____ times a day.  Strengthening exercises These exercises build strength and endurance in your back. Endurance is the ability to use your muscles for a long time, even after they get tired.  Alternating arm and leg raises  1. Get on your hands and knees on a firm surface. If you are on a hard floor, you may want to use padding, such as an exercise mat, to cushion your knees. 2. Line up your arms and legs. Your hands should be directly below your shoulders, and your knees should be directly below your hips. 3. Lift your left leg behind you. At the same time, raise your right arm and straighten it in front of you. ? Do not lift your leg higher than your hip. ? Do not lift your arm higher than your shoulder. ? Keep your abdominal and back muscles tight. ? Keep your hips facing  the ground. ? Do not arch your back. ? Keep your balance carefully, and do not hold your breath. 4. Hold this position for ___15_______ seconds. 5. Slowly return to the starting position. 6. Repeat with your right leg and your left arm. Repeat ___3_______ times. Complete this exercise ______1____ times a day.  Abdominal set with straight leg raise  1. Lie on your back on a firm surface. 2. Bend one of your knees and keep your other leg straight. 3. Tense your abdominal muscles and lift your straight leg up, 4-6 inches (10-15 cm) off the ground. 4. Keep your abdominal muscles tight and  hold this position for _____15_____ seconds. ? Do not hold your breath. ? Do not arch your back. Keep it flat against the ground. 5. Keep your abdominal muscles tense as you slowly lower your leg back to the starting position. 6. Repeat with your other leg. Repeat ____3______ times. Complete this exercise ____1______ times a day.  Single leg lower with bent knees 1. Lie on your back on a firm surface. 2. Tense your abdominal muscles and lift your feet off the floor, one foot at a time, so your knees and hips are bent in 90-degree angles (right angles). ? Your knees should be over your hips and your lower legs should be parallel to the floor. 3. Keeping your abdominal muscles tense and your knee bent, slowly lower one of your legs so your toe touches the ground. 4. Lift your leg back up to return to the starting position. ? Do not hold your breath. ? Do not let your back arch. Keep your back flat against the ground. 5. Repeat with your other leg. Repeat _____3_____ times. Complete this exercise ____1______ times a day.

## 2019-03-01 NOTE — Assessment & Plan Note (Addendum)
Patient is a 69 year old male who has been smoking for 47 years. Has cut back from 2 ppd to 1/2 ppd, is currently smoking 1/2 ppd. Per the USPSTF Guidelines: "The USPSTF recommends annual screening for lung cancer with low-dose computed tomography (LDCT) in adults aged 78 to 80 years who have a 30 pack-year smoking history and currently smoke or have quit within the past 15 years. Screening should be discontinued once a person has not smoked for 15 years or develops a health problem that substantially limits life expectancy or the ability or willingness to have curative lung surgery." - Ordered low dose CT lung screening for Lung CA Screening (10211) - Ordered US of abdomen to screen for AAA.

## 2019-03-11 ENCOUNTER — Other Ambulatory Visit: Payer: Self-pay | Admitting: Family Medicine

## 2019-03-11 DIAGNOSIS — F17200 Nicotine dependence, unspecified, uncomplicated: Secondary | ICD-10-CM

## 2019-03-15 ENCOUNTER — Other Ambulatory Visit: Payer: Self-pay | Admitting: Family Medicine

## 2019-03-15 ENCOUNTER — Ambulatory Visit: Payer: 59

## 2019-03-15 ENCOUNTER — Ambulatory Visit
Admission: RE | Admit: 2019-03-15 | Discharge: 2019-03-15 | Disposition: A | Discharge status: Home / Self Care | Payer: 59 | Source: Ambulatory Visit | Attending: Family Medicine | Admitting: Family Medicine

## 2019-03-15 DIAGNOSIS — M545 Low back pain, unspecified: Secondary | ICD-10-CM

## 2019-03-15 NOTE — Addendum Note (Signed)
Addended by: Dollene Cleveland on: 03/15/2019 03:31 PM   Modules accepted: Orders

## 2019-06-25 ENCOUNTER — Ambulatory Visit (INDEPENDENT_AMBULATORY_CARE_PROVIDER_SITE_OTHER): Payer: Medicare Other | Admitting: Family Medicine

## 2019-06-25 ENCOUNTER — Other Ambulatory Visit: Payer: Self-pay

## 2019-06-25 ENCOUNTER — Encounter: Payer: Self-pay | Admitting: Family Medicine

## 2019-06-25 VITALS — BP 144/74 | HR 80 | Wt 173.0 lb

## 2019-06-25 DIAGNOSIS — R059 Cough, unspecified: Secondary | ICD-10-CM

## 2019-06-25 DIAGNOSIS — R911 Solitary pulmonary nodule: Secondary | ICD-10-CM | POA: Diagnosis not present

## 2019-06-25 DIAGNOSIS — I700 Atherosclerosis of aorta: Secondary | ICD-10-CM

## 2019-06-25 DIAGNOSIS — F17210 Nicotine dependence, cigarettes, uncomplicated: Secondary | ICD-10-CM

## 2019-06-25 DIAGNOSIS — IMO0001 Reserved for inherently not codable concepts without codable children: Secondary | ICD-10-CM

## 2019-06-25 DIAGNOSIS — Z1211 Encounter for screening for malignant neoplasm of colon: Secondary | ICD-10-CM

## 2019-06-25 DIAGNOSIS — R05 Cough: Secondary | ICD-10-CM

## 2019-06-25 NOTE — Progress Notes (Signed)
SUBJECTIVE:   CHIEF COMPLAINT / HPI:   Cough at night: Patient presents to clinic today reporting that about 2 weeks ago he started having a cough "at nighttime".  He further explains that he usually wakes up around 5-6 AM and as soon as he wakes up he starts to have a cough productive for gray/brown phlegm.  He denies seeing any blood in the sputum.  He denies having any coughing during the day.  He reports some shortness of breath when cutting the grass, but this is normal for him.  He also reports some nasal congestion for which he uses fluticasone.  He denies any sneezing, chest pain, shortness of breath, also denies any issues with breathing while lying flat.  He reports that he occasionally wake up sweaty but not "drenched", denies any weight loss.  He does have a history of GERD for which he takes omeprazole (not in chart).  He also reports snoring at night and some dry mouth, is concerned this might be contributing to his cough.  Concerningly, this patient has a significant smoking history (40+ year smoking history).  He used to smoke 2 packs daily but is now smoking only 3 cigarettes daily.  He had a low-dose chest CT in 2017 which showed "2.0 mm subpleural nodule in the right lower lobe. Lung-RADS Category 2, benign appearance or behavior. Continue annual screening with low-dose chest CT without contrast in 12 months".  He is overdue for repeat CT chest.  He had ultrasound of his aorta performed in February 2021 which was negative for abdominal aortic aneurysm.  Need for colonoscopy: According to the patient's chart he is overdue for colonoscopy.  Patient reports he had a colonoscopy about 3 years ago.  We will work on obtaining results.  Still smoking: smokes cigarette with coffee, smokes 3 cigarettes daily, he has not currently taking Chantix.  Deaths in family: sister just passed away and his wife's brother died a week later, wife's first cousin died, went to 3 funerals in less than 3  weeks, his first cousin died.  He is talking to a psychiatrist about all of this, states he feels very well supported.  PERTINENT  PMH / PSH:  Patient Active Problem List   Diagnosis Date Noted  . Aortic arch atherosclerosis ([place]) 06/28/2019  . Encounter for screening colonoscopy for non-high-risk patient 06/28/2019  . Lung nodule < 6cm on CT 06/28/2019  . Acute midline low back pain without sciatica 03/01/2019  . Smoking greater than 40 pack years 03/01/2019  . Cough 04/16/2018  . Cigarette nicotine dependence without complication 95/18/8416  . GERD 08/23/2008  . HYPERCHOLESTEROLEMIA 04/10/2006  . HYPERTENSION, BENIGN SYSTEMIC 04/10/2006  . Back pain 04/10/2006     OBJECTIVE:   BP (!) 144/74   Pulse 80   Wt 173 lb (78.5 kg)   SpO2 98%   BMI 24.82 kg/m    Physical Exam: General: No apparent distress, nontoxic-appearing Respiratory: CTA bilaterally, moving air well, no adventitious sounds appreciated Cardiac: RRR, S1-S2 present, no murmurs Abdomen: Soft, nontender, no masses, normal bowel sounds appreciated Extremities: Mild nail clubbing appreciated to bilateral upper extremities  ASSESSMENT/PLAN:   Cough Patient with 2 weeks of new onset cough, of note decreased from 4 cigarettes down to 3 cigarettes daily about 2 months ago.  No other concerning signs or symptoms concerning for viral or bacterial upper respiratory infection.  No B symptoms concerning for malignancy.  Cough is productive for gray/brown sputum which could be improved  function in ciliary elevator in has lungs.  Could also consider patient's GERD as possibly contributing to his cough. -Continue PPI -Supportive care -Patient in need of CT chest for follow-up for extensive smoking history -Patient encouraged to continue to decrease smoking with goal to quit -Strict return precautions given  Smoking greater than 40 pack years Insurance contacted, patient has received authorization for screening CT chest.  -Scheduled for July 13, 2019 -Authorization number [id number]  Encounter for screening colonoscopy for non-high-risk patient No history of colonoscopy in patient's chart, however he states he had colonoscopy about 3 years ago but is instructed to get another one soon. -Referral to GI for repeat colonoscopy -Patient instructed to fill out forms to allow for transfer records  Lung nodule < 6cm on CT As appreciated on 2017 CT chest, supposed to be followed up on every 12 months, has not been imaged since then. -CT chest ordered and scheduled for July 13, 2019     Dollene Cleveland, DO [facility]

## 2019-06-25 NOTE — Patient Instructions (Addendum)
Return in 1 month to follow up for your CT scan of your chest.   Congratulations on CUTTING BACK ON SMOKING!!!!  Please give [place] records for your colonoscopy!!! Get your next colonoscopy!!!    Peggyann Shoals, DO [facility], PGY-2 06/25/2019 3:34 PM

## 2019-06-28 DIAGNOSIS — R911 Solitary pulmonary nodule: Secondary | ICD-10-CM | POA: Insufficient documentation

## 2019-06-28 DIAGNOSIS — Z1211 Encounter for screening for malignant neoplasm of colon: Secondary | ICD-10-CM | POA: Insufficient documentation

## 2019-06-28 DIAGNOSIS — IMO0001 Reserved for inherently not codable concepts without codable children: Secondary | ICD-10-CM | POA: Insufficient documentation

## 2019-06-28 DIAGNOSIS — I700 Atherosclerosis of aorta: Secondary | ICD-10-CM | POA: Insufficient documentation

## 2019-06-29 ENCOUNTER — Encounter: Payer: Self-pay | Admitting: Family Medicine

## 2019-06-29 NOTE — Assessment & Plan Note (Signed)
No history of colonoscopy in patient's chart, however he states he had colonoscopy about 3 years ago but is instructed to get another one soon. -Referral to GI for repeat colonoscopy -Patient instructed to fill out forms to allow for transfer records

## 2019-06-29 NOTE — Assessment & Plan Note (Addendum)
Patient with 2 weeks of new onset cough, of note decreased from 4 cigarettes down to 3 cigarettes daily about 2 months ago.  No other concerning signs or symptoms concerning for viral or bacterial upper respiratory infection.  No B symptoms concerning for malignancy.  Cough is productive for gray/brown sputum which could be improved function in ciliary elevator in has lungs.  Could also consider patient's GERD as possibly contributing to his cough. -Continue PPI -Supportive care -Patient in need of CT chest for follow-up for extensive smoking history -Patient encouraged to continue to decrease smoking with goal to quit -Strict return precautions given

## 2019-06-29 NOTE — Assessment & Plan Note (Signed)
Insurance contacted, patient has received authorization for screening CT chest. -Scheduled for July 13, 2019 -Authorization number [id number]

## 2019-06-29 NOTE — Assessment & Plan Note (Signed)
As appreciated on 2017 CT chest, supposed to be followed up on every 12 months, has not been imaged since then. -CT chest ordered and scheduled for July 13, 2019

## 2019-07-13 ENCOUNTER — Ambulatory Visit (HOSPITAL_COMMUNITY): Admission: RE | Admit: 2019-07-13 | Payer: Medicare Other | Source: Ambulatory Visit

## 2019-07-27 ENCOUNTER — Encounter: Payer: Self-pay | Admitting: Family Medicine

## 2019-07-27 ENCOUNTER — Other Ambulatory Visit: Payer: Self-pay | Admitting: Family Medicine

## 2020-02-22 ENCOUNTER — Other Ambulatory Visit: Payer: Self-pay

## 2020-02-22 ENCOUNTER — Ambulatory Visit (INDEPENDENT_AMBULATORY_CARE_PROVIDER_SITE_OTHER): Payer: Medicare Other | Admitting: *Deleted

## 2020-02-22 DIAGNOSIS — Z23 Encounter for immunization: Secondary | ICD-10-CM | POA: Diagnosis not present

## 2020-06-19 ENCOUNTER — Other Ambulatory Visit: Payer: Self-pay

## 2020-06-19 ENCOUNTER — Encounter: Payer: Self-pay | Admitting: Family Medicine

## 2020-06-19 ENCOUNTER — Ambulatory Visit (INDEPENDENT_AMBULATORY_CARE_PROVIDER_SITE_OTHER): Payer: Medicare Other | Admitting: Family Medicine

## 2020-06-19 VITALS — BP 140/87 | HR 73 | Ht 70.0 in | Wt 175.8 lb

## 2020-06-19 DIAGNOSIS — R059 Cough, unspecified: Secondary | ICD-10-CM

## 2020-06-19 DIAGNOSIS — Z13220 Encounter for screening for lipoid disorders: Secondary | ICD-10-CM | POA: Insufficient documentation

## 2020-06-19 DIAGNOSIS — I10 Essential (primary) hypertension: Secondary | ICD-10-CM | POA: Diagnosis not present

## 2020-06-19 DIAGNOSIS — F17210 Nicotine dependence, cigarettes, uncomplicated: Secondary | ICD-10-CM | POA: Diagnosis not present

## 2020-06-19 DIAGNOSIS — Z1211 Encounter for screening for malignant neoplasm of colon: Secondary | ICD-10-CM

## 2020-06-19 DIAGNOSIS — Z131 Encounter for screening for diabetes mellitus: Secondary | ICD-10-CM | POA: Insufficient documentation

## 2020-06-19 DIAGNOSIS — R911 Solitary pulmonary nodule: Secondary | ICD-10-CM

## 2020-06-19 DIAGNOSIS — IMO0001 Reserved for inherently not codable concepts without codable children: Secondary | ICD-10-CM

## 2020-06-19 MED ORDER — FLUTICASONE PROPIONATE 50 MCG/ACT NA SUSP: 2.0000 | Freq: Every day | NASAL | 0 refills | Status: DC

## 2020-06-19 NOTE — Progress Notes (Signed)
Contacted pt for the 2nd time to let him know about his aapt. Pt did not answer.

## 2020-06-19 NOTE — Progress Notes (Signed)
SUBJECTIVE:   CHIEF COMPLAINT / HPI:   Follow up appt / Preventive med check up??  Cough: started coughing 3 weeks ago. Cough is worse in the morning. He drinks coffee in the morning then smokes a cigarette, then he starts coughing. He also coughs throughout the day. He spits up a whole lot of clear phlegm, he denies seeing any blood, denies seeing gray or brown colored  sputum. He is also coughing a lot during the night time but that is a dry cough, but he is able to drink some water and this goes away.   Denies fevers, sore throat, weight loss, coughing up blood, runny nose, body aches, chills, nausea, vomiting, diarrhea, abdominal pain.  Endorses itchy eyes, sneezing, cough with phlegm during the day, stuffy nose, dry cough at night    He is smoking 5-10 cigarettes every day. He has been trying cough drops at home.   Colonoscopy: had 1 about 8 years ago.  He does not know who his doctor was, what results were, or when he is supposed to have another one.  CT Chest for Nodule follow up: Patient has significant smoking history of 40+ years.  He continues to smoke 5 and 10 cigarettes daily.  His last dose CT chest was in March 2017 which showed "2.0 mm subpleural nodule in the right lower lobe. Lung-RADS Category 2, benign appearance or behavior."  Annual screening with low-dose CT chest in 12 months was recommended, however patient has not been back for low-dose CT chest.  Will place order today.    PERTINENT  PMH / PSH:  Patient Active Problem List   Diagnosis Date Noted  . Screening for cholesterol level 06/19/2020  . Screening for colon cancer 06/19/2020  . Screening for diabetes mellitus 06/19/2020  . Aortic arch atherosclerosis (HCC) 06/28/2019  . Encounter for screening colonoscopy for non-high-risk patient 06/28/2019  . Lung nodule < 6cm on CT 06/28/2019  . Acute midline low back pain without sciatica 03/01/2019  . Smoking greater than 40 pack years 03/01/2019  . Cough  04/16/2018  . Cigarette nicotine dependence without complication 05/05/2015  . GERD 08/23/2008  . HYPERCHOLESTEROLEMIA 04/10/2006  . HYPERTENSION, BENIGN SYSTEMIC 04/10/2006    OBJECTIVE:   BP 140/87   Pulse 73   Ht 5\' 10"  (1.778 m)   Wt 175 lb 12.8 oz (79.7 kg)   SpO2 95%   BMI 25.22 kg/m    Physical exam: General: Well-appearing, nontoxic-appearing HEENT: Normocephalic, atraumatic, EOMI, PERRLA, anicteric sclera, bilateral nares with edematous erythematous turbinates that remain patent; no pharyngeal erythema or tonsillar exudates Neck: No lymphadenopathy appreciated, neck supple with full range of motion Respiratory: CTA bilaterally, normal work of breathing on room air, no wheezes appreciated Cardio: RRR, S1-S2 present, no murmurs appreciated  ASSESSMENT/PLAN:   Smoking greater than 40 pack years Patient's last low dose CT Chest was in 2017, due to presence of nodule patient recommended to have yearly scans. -Scan scheduled for 07/04/2020 at 10:40 AM at [facility] imaging at [address]. -Patient strongly encouraged to stop smoking  Screening for cholesterol level Lipid panel today reveals LDL elevated at 183, HDL 46, and total cholesterol elevated at 251.  Patient's ASCVD risk 25.2%.  Patient was previously prescribed Lipitor 80 mg.  Patient is no longer taking this. -We will request patient starts Lipitor 80 mg again daily -Plan to recheck patient's lipid panel 3 months after being restarted on Lipitor  Cough Patient with several days of new onset  cough.  Patient continues to smoke about 4-5 cigarettes daily.  On physical exam patient without other concerning sides or felt to suggest for viral/bacterial URI.  No B symptoms concerning for malignancy.  Cough produces clear sputum.  Patient with history of GERD which could be contributing to his cough, he is currently not taking anything for this.  Also consider seasonal allergies sneezing, runny nose, itchy  eyes. -Recommended 1 tablespoon of Honey 3-4 times daily to help reduce cough. Spray flonase into each nostril once daily to help reduce runny/stuffy nose. Can take Tylenol 1000mg  2-3 times daily for any body aches, headaches.  -Order for CT scan of chest placed (May 24th). This was also ordered last year in May 2021 but patient did not go -Patient scheduled for follow up May 23rd     May 25, DO [facility]

## 2020-06-19 NOTE — Patient Instructions (Addendum)
Thank you for coming in to see [name] today! Please see below to review our plan for today's visit:  1. Take 1 tablespoon of Honey 3-4 times daily to help reduce your cough. Spray flonase into each nostril once daily to help reduce runny/stuffy nose. You can take Tylenol 1000mg  2-3 times daily for any body aches, headaches.  2. We are making a referral to GI for colonoscopy.  3. I am placing an order for CT scan of your chest as screening for lung issues with your smoking history.   Please follow up in 2 weeks or so to make sure we are getting your screening exams done and to recheck your Blood pressure.   Please call the clinic at [phone] if your symptoms worsen or you have any concerns. It was our pleasure to serve you!   Dr. [phone] [facility]

## 2020-06-19 NOTE — Progress Notes (Signed)
Contacted pt about his CT scan appt that I scheduled for him.Pt did not answer. I left a vm for him. Will try again later.

## 2020-06-20 LAB — LIPID PANEL
Chol/HDL Ratio: 5.5 ratio — ABNORMAL HIGH (ref 0.0–5.0)
Cholesterol, Total: 251 mg/dL — ABNORMAL HIGH (ref 100–199)
HDL: 46 mg/dL (ref >39)
LDL Chol Calc (NIH): 183 mg/dL — ABNORMAL HIGH (ref 0–99)
Triglycerides: 121 mg/dL (ref 0–149)
VLDL Cholesterol Cal: 22 mg/dL (ref 5–40)

## 2020-06-20 LAB — BASIC METABOLIC PANEL
BUN/Creatinine Ratio: 5 — ABNORMAL LOW (ref 10–24)
BUN: 5 mg/dL — ABNORMAL LOW (ref 8–27)
CO2: 22 mmol/L (ref 20–29)
Calcium: 9.6 mg/dL (ref 8.6–10.2)
Chloride: 105 mmol/L (ref 96–106)
Creatinine, Ser: 0.91 mg/dL (ref 0.76–1.27)
Glucose: 92 mg/dL (ref 65–99)
Potassium: 3.9 mmol/L (ref 3.5–5.2)
Sodium: 141 mmol/L (ref 134–144)
eGFR: 90 mL/min/{1.73_m2} (ref >59)

## 2020-06-22 ENCOUNTER — Encounter: Payer: Self-pay | Admitting: Family Medicine

## 2020-06-22 NOTE — Assessment & Plan Note (Signed)
Patient's last low dose CT Chest was in 2017, due to presence of nodule patient recommended to have yearly scans. -Scan scheduled for 07/04/2020 at 10:40 AM at [facility] imaging at [address]. -Patient strongly encouraged to stop smoking

## 2020-06-22 NOTE — Assessment & Plan Note (Signed)
Lipid panel today reveals LDL elevated at 183, HDL 46, and total cholesterol elevated at 251.  Patient's ASCVD risk 25.2%.  Patient was previously prescribed Lipitor 80 mg.  Patient is no longer taking this. -We will request patient starts Lipitor 80 mg again daily -Plan to recheck patient's lipid panel 3 months after being restarted on Lipitor

## 2020-06-22 NOTE — Assessment & Plan Note (Signed)
Patient with several days of new onset cough.  Patient continues to smoke about 4-5 cigarettes daily.  On physical exam patient without other concerning sides or felt to suggest for viral/bacterial URI.  No B symptoms concerning for malignancy.  Cough produces clear sputum.  Patient with history of GERD which could be contributing to his cough, he is currently not taking anything for this.  Also consider seasonal allergies sneezing, runny nose, itchy eyes. -Recommended 1 tablespoon of Honey 3-4 times daily to help reduce cough. Spray flonase into each nostril once daily to help reduce runny/stuffy nose. Can take Tylenol 1000mg  2-3 times daily for any body aches, headaches.  -Order for CT scan of chest placed (May 24th). This was also ordered last year in May 2021 but patient did not go -Patient scheduled for follow up May 23rd

## 2020-07-03 ENCOUNTER — Encounter: Payer: Self-pay | Admitting: Family Medicine

## 2020-07-03 ENCOUNTER — Ambulatory Visit (INDEPENDENT_AMBULATORY_CARE_PROVIDER_SITE_OTHER): Payer: Medicare Other | Admitting: Family Medicine

## 2020-07-03 ENCOUNTER — Other Ambulatory Visit: Payer: Self-pay

## 2020-07-03 VITALS — BP 127/70 | HR 80 | Ht 70.0 in | Wt 173.4 lb

## 2020-07-03 DIAGNOSIS — E7800 Pure hypercholesterolemia, unspecified: Secondary | ICD-10-CM | POA: Diagnosis not present

## 2020-07-03 DIAGNOSIS — F17210 Nicotine dependence, cigarettes, uncomplicated: Secondary | ICD-10-CM | POA: Diagnosis not present

## 2020-07-03 DIAGNOSIS — Z1211 Encounter for screening for malignant neoplasm of colon: Secondary | ICD-10-CM | POA: Diagnosis not present

## 2020-07-03 MED ORDER — ATORVASTATIN CALCIUM 40 MG PO TABS
40.0000 mg | ORAL_TABLET | Freq: Every day | ORAL | 3 refills | Status: DC
Start: 2020-07-03 — End: 2021-08-27

## 2020-07-03 NOTE — Progress Notes (Signed)
    SUBJECTIVE:   CHIEF COMPLAINT / HPI:   Colonoscopy: Patient is due for colonoscopy, at his last appointment he was referred to low-power GI.  As of now he has not yet heard from with our GI regarding an appointment.  Chest CT: Patient is scheduled for low-dose CT of his chest tomorrow 5/24 for his 40+ year pack history.  He was reminded of this appointment today as he has not had the scan done since 2017 and supposed to have it done annually.  HLD: patient with recent Lipid panel showing cholesterol 251, LDL 120, HDL was normal at 46.  Patient used to be on Lipitor 80 mg daily however has not been taking this medication for quite some time.  Patient was represcribed this medication today.  Contacting patient: Patient reports he has a hard time hearing his phone when people call to remind him of appointments or for other reasons.  He prefers that people call his wife Eber Jones [phone]  PERTINENT  PMH / PSH:  Patient Active Problem List   Diagnosis Date Noted  . Screening for cholesterol level 06/19/2020  . Screening for colon cancer 06/19/2020  . Screening for diabetes mellitus 06/19/2020  . Aortic arch atherosclerosis (HCC) 06/28/2019  . Encounter for screening colonoscopy for non-high-risk patient 06/28/2019  . Lung nodule < 6cm on CT 06/28/2019  . Acute midline low back pain without sciatica 03/01/2019  . Smoking greater than 40 pack years 03/01/2019  . Cough 04/16/2018  . Cigarette nicotine dependence without complication 05/05/2015  . GERD 08/23/2008  . HYPERCHOLESTEROLEMIA 04/10/2006  . HYPERTENSION, BENIGN SYSTEMIC 04/10/2006     OBJECTIVE:   BP 127/70   Pulse 80   Ht 5\' 10"  (1.778 m)   Wt 173 lb 6.4 oz (78.7 kg)   SpO2 96%   BMI 24.88 kg/m    Physical exam: General: Well-appearing patient, no apparent distress Respiratory: Comfortable work of breathing on room air   ASSESSMENT/PLAN:   Screening for colon cancer -Patient given contact information for  [facility] to get scheduled for his colonoscopy  HYPERCHOLESTEROLEMIA ASCVD risk today 21.6%. - Patient started on Lipitor 40 mg, if he tolerates this well can increase it to Lipitor 80 mg daily. -Asked patient to come back in August 2022 for repeat lipid panel   Smoking greater than 40 pack years -Patient scheduled for low-dose CT chest tomorrow at [address] and went over with [facility] imaging -Patient was reminded of this appointment tomorrow     [facility], DO [facility]

## 2020-07-03 NOTE — Patient Instructions (Signed)
Thank you for coming in to see [place] today! Please see below to review our plan for today's visit:  1. Call [facility] [address] [place], [place], [place] [address] Phone: [phone] 2. I have re-prescribed Lipitor 40mg  for you (a medication to lower cholesterol). Please take this medication daily. We can recheck your Lipid Panel/Cholesterol levels in 3 months.  3. Go to your CT Scan of your chest tomorrow at [address], 10:15am.   Please call the clinic at [phone] if your symptoms worsen or you have any concerns. It was our pleasure to serve you!   Dr. [phone] [facility]

## 2020-07-03 NOTE — Assessment & Plan Note (Signed)
-  Patient given contact information for [facility] to get scheduled for his colonoscopy

## 2020-07-03 NOTE — Assessment & Plan Note (Signed)
ASCVD risk today 21.6%. - Patient started on Lipitor 40 mg, if he tolerates this well can increase it to Lipitor 80 mg daily. -Asked patient to come back in August 2022 for repeat lipid panel

## 2020-07-03 NOTE — Assessment & Plan Note (Signed)
-  Patient scheduled for low-dose CT chest tomorrow at [address] and went over with [facility] imaging -Patient was reminded of this appointment tomorrow

## 2020-07-04 ENCOUNTER — Ambulatory Visit
Admission: RE | Admit: 2020-07-04 | Discharge: 2020-07-04 | Disposition: A | Discharge status: Home / Self Care | Payer: Medicare Other | Source: Ambulatory Visit | Attending: Family Medicine | Admitting: Family Medicine

## 2020-07-04 DIAGNOSIS — IMO0001 Reserved for inherently not codable concepts without codable children: Secondary | ICD-10-CM

## 2020-07-04 DIAGNOSIS — F17210 Nicotine dependence, cigarettes, uncomplicated: Secondary | ICD-10-CM

## 2020-07-04 DIAGNOSIS — R911 Solitary pulmonary nodule: Secondary | ICD-10-CM

## 2020-07-06 ENCOUNTER — Other Ambulatory Visit: Payer: Self-pay | Admitting: Family Medicine

## 2020-07-06 ENCOUNTER — Telehealth: Payer: Self-pay

## 2020-07-06 DIAGNOSIS — IMO0001 Reserved for inherently not codable concepts without codable children: Secondary | ICD-10-CM

## 2020-07-06 DIAGNOSIS — R911 Solitary pulmonary nodule: Secondary | ICD-10-CM

## 2020-07-06 NOTE — Telephone Encounter (Signed)
-----   Message from Henri Medal, [place] sent at 07/06/2020  3:45 PM EDT ----- Ct is ready to be scheduled.  No prior Berkley Harvey is needed.  Jazmin Hartsell,CMA

## 2020-07-07 NOTE — Telephone Encounter (Signed)
2nd attempt to reach pt to inform of Appt Date/Time/Location  07/25/2020 11:50 AM  at  [facility] AT [address]  for his CT scan Aquilla Solian, CMA

## 2020-07-07 NOTE — Telephone Encounter (Signed)
-----   Message from Jazmin M Hartsell, CMA sent at 07/06/2020  3:45 PM EDT ----- Ct is ready to be scheduled.  No prior auth is needed.  Jazmin Hartsell,CMA   

## 2020-07-25 ENCOUNTER — Ambulatory Visit: Payer: Medicare Other

## 2020-09-19 ENCOUNTER — Inpatient Hospital Stay: Admission: RE | Admit: 2020-09-19 | Payer: Medicare Other | Source: Ambulatory Visit

## 2020-11-06 ENCOUNTER — Encounter: Payer: Self-pay | Admitting: Internal Medicine

## 2020-11-07 ENCOUNTER — Encounter: Payer: Self-pay | Admitting: Internal Medicine

## 2020-11-28 ENCOUNTER — Encounter (HOSPITAL_COMMUNITY): Payer: Self-pay | Admitting: Emergency Medicine

## 2020-11-28 ENCOUNTER — Emergency Department (HOSPITAL_COMMUNITY): Payer: Medicare Other

## 2020-11-28 ENCOUNTER — Emergency Department (HOSPITAL_COMMUNITY)
Admission: EM | Admit: 2020-11-28 | Discharge: 2020-11-28 | Disposition: A | Discharge status: Home / Self Care | Payer: Medicare Other | Attending: Emergency Medicine | Admitting: Emergency Medicine

## 2020-11-28 DIAGNOSIS — I10 Essential (primary) hypertension: Secondary | ICD-10-CM | POA: Diagnosis not present

## 2020-11-28 DIAGNOSIS — S60222A Contusion of left hand, initial encounter: Secondary | ICD-10-CM | POA: Diagnosis not present

## 2020-11-28 DIAGNOSIS — S8002XA Contusion of left knee, initial encounter: Secondary | ICD-10-CM | POA: Diagnosis not present

## 2020-11-28 DIAGNOSIS — F17210 Nicotine dependence, cigarettes, uncomplicated: Secondary | ICD-10-CM | POA: Insufficient documentation

## 2020-11-28 DIAGNOSIS — Z23 Encounter for immunization: Secondary | ICD-10-CM | POA: Diagnosis not present

## 2020-11-28 DIAGNOSIS — S60221A Contusion of right hand, initial encounter: Secondary | ICD-10-CM | POA: Diagnosis not present

## 2020-11-28 DIAGNOSIS — Z79899 Other long term (current) drug therapy: Secondary | ICD-10-CM | POA: Diagnosis not present

## 2020-11-28 DIAGNOSIS — J323 Chronic sphenoidal sinusitis: Secondary | ICD-10-CM | POA: Diagnosis not present

## 2020-11-28 DIAGNOSIS — S0101XA Laceration without foreign body of scalp, initial encounter: Secondary | ICD-10-CM | POA: Insufficient documentation

## 2020-11-28 DIAGNOSIS — S0990XA Unspecified injury of head, initial encounter: Secondary | ICD-10-CM | POA: Diagnosis not present

## 2020-11-28 DIAGNOSIS — S0181XA Laceration without foreign body of other part of head, initial encounter: Secondary | ICD-10-CM | POA: Diagnosis not present

## 2020-11-28 DIAGNOSIS — S8001XA Contusion of right knee, initial encounter: Secondary | ICD-10-CM | POA: Diagnosis not present

## 2020-11-28 DIAGNOSIS — T07XXXA Unspecified multiple injuries, initial encounter: Secondary | ICD-10-CM

## 2020-11-28 DIAGNOSIS — Y9355 Activity, bike riding: Secondary | ICD-10-CM | POA: Diagnosis not present

## 2020-11-28 MED ORDER — LIDOCAINE-EPINEPHRINE (PF) 2 %-1:200000 IJ SOLN
10.0000 mL | Freq: Once | INTRAMUSCULAR | Status: AC
  Administered 2020-11-28: 10 mL
  Filled 2020-11-28: qty 20

## 2020-11-28 MED ORDER — TETANUS-DIPHTH-ACELL PERTUSSIS 5-2.5-18.5 LF-MCG/0.5 IM SUSY
0.5000 mL | PREFILLED_SYRINGE | Freq: Once | INTRAMUSCULAR | Status: AC
  Administered 2020-11-28: 0.5 mL via INTRAMUSCULAR
  Filled 2020-11-28: qty 0.5

## 2020-11-28 MED ORDER — LIDOCAINE-EPINEPHRINE (PF) 2 %-1:200000 IJ SOLN: 20.0000 mL | Freq: Once | INTRAMUSCULAR | Status: DC

## 2020-11-28 NOTE — Discharge Instructions (Addendum)
You were seen in the ER after you had a fall and suffered a laceration.  6 dissolvable sutures were applied to your forehead.  The sutures should dissolve in couple of weeks.  If they do not dissolve and start causing irritation, please see your primary care doctor  Keep the wound site clean and dry.
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
## 2020-12-01 ENCOUNTER — Ambulatory Visit: Payer: Medicare Other | Admitting: Internal Medicine

## 2020-12-22 ENCOUNTER — Encounter: Payer: Self-pay | Admitting: *Deleted

## 2020-12-22 ENCOUNTER — Ambulatory Visit (INDEPENDENT_AMBULATORY_CARE_PROVIDER_SITE_OTHER): Payer: Medicare Other | Admitting: *Deleted

## 2020-12-22 ENCOUNTER — Other Ambulatory Visit: Payer: Self-pay

## 2020-12-22 DIAGNOSIS — Z23 Encounter for immunization: Secondary | ICD-10-CM | POA: Diagnosis not present

## 2020-12-29 ENCOUNTER — Ambulatory Visit: Payer: Medicare Other | Admitting: Internal Medicine

## 2021-01-02 ENCOUNTER — Encounter: Payer: Self-pay | Admitting: Family Medicine

## 2021-01-08 ENCOUNTER — Inpatient Hospital Stay: Admission: RE | Admit: 2021-01-08 | Payer: Medicare Other | Source: Ambulatory Visit

## 2021-01-11 ENCOUNTER — Encounter (HOSPITAL_COMMUNITY)
Admission: EM | Disposition: A | Discharge status: Home / Self Care | Payer: Self-pay | Source: Home / Self Care | Attending: Neurological Surgery

## 2021-01-11 ENCOUNTER — Inpatient Hospital Stay (HOSPITAL_COMMUNITY): Payer: Medicare Other | Admitting: Anesthesiology

## 2021-01-11 ENCOUNTER — Inpatient Hospital Stay (HOSPITAL_COMMUNITY)
Admission: EM | Admit: 2021-01-11 | Discharge: 2021-01-13 | DRG: 025 | Disposition: A | Discharge status: Home / Self Care | Payer: Medicare Other | Attending: Neurological Surgery | Admitting: Neurological Surgery

## 2021-01-11 ENCOUNTER — Encounter (HOSPITAL_COMMUNITY): Payer: Self-pay

## 2021-01-11 ENCOUNTER — Emergency Department (HOSPITAL_COMMUNITY): Payer: Medicare Other

## 2021-01-11 DIAGNOSIS — Z833 Family history of diabetes mellitus: Secondary | ICD-10-CM

## 2021-01-11 DIAGNOSIS — I10 Essential (primary) hypertension: Secondary | ICD-10-CM | POA: Diagnosis present

## 2021-01-11 DIAGNOSIS — H269 Unspecified cataract: Secondary | ICD-10-CM | POA: Diagnosis present

## 2021-01-11 DIAGNOSIS — R296 Repeated falls: Secondary | ICD-10-CM | POA: Diagnosis present

## 2021-01-11 DIAGNOSIS — Z20822 Contact with and (suspected) exposure to covid-19: Secondary | ICD-10-CM | POA: Diagnosis present

## 2021-01-11 DIAGNOSIS — F17210 Nicotine dependence, cigarettes, uncomplicated: Secondary | ICD-10-CM | POA: Diagnosis present

## 2021-01-11 DIAGNOSIS — S06A0XA Traumatic brain compression without herniation, initial encounter: Secondary | ICD-10-CM | POA: Diagnosis present

## 2021-01-11 DIAGNOSIS — Z8249 Family history of ischemic heart disease and other diseases of the circulatory system: Secondary | ICD-10-CM | POA: Diagnosis not present

## 2021-01-11 DIAGNOSIS — Y9355 Activity, bike riding: Secondary | ICD-10-CM

## 2021-01-11 DIAGNOSIS — S065XAA Traumatic subdural hemorrhage with loss of consciousness status unknown, initial encounter: Secondary | ICD-10-CM | POA: Diagnosis present

## 2021-01-11 DIAGNOSIS — K219 Gastro-esophageal reflux disease without esophagitis: Secondary | ICD-10-CM | POA: Diagnosis present

## 2021-01-11 HISTORY — PX: BURR HOLE: SHX908

## 2021-01-11 LAB — CBC
HCT: 48.3 % (ref 39.0–52.0)
Hemoglobin: 15.2 g/dL (ref 13.0–17.0)
MCH: 27.8 pg (ref 26.0–34.0)
MCHC: 31.5 g/dL (ref 30.0–36.0)
MCV: 88.5 fL (ref 80.0–100.0)
Platelets: 352 10*3/uL (ref 150–400)
RBC: 5.46 MIL/uL (ref 4.22–5.81)
RDW: 14.5 % (ref 11.5–15.5)
WBC: 7.6 10*3/uL (ref 4.0–10.5)
nRBC: 0 % (ref 0.0–0.2)

## 2021-01-11 LAB — I-STAT CHEM 8, ED
BUN: 6 mg/dL — ABNORMAL LOW (ref 8–23)
Calcium, Ion: 1.07 mmol/L — ABNORMAL LOW (ref 1.15–1.40)
Chloride: 108 mmol/L (ref 98–111)
Creatinine, Ser: 0.7 mg/dL (ref 0.61–1.24)
Glucose, Bld: 91 mg/dL (ref 70–99)
HCT: 47 % (ref 39.0–52.0)
Hemoglobin: 16 g/dL (ref 13.0–17.0)
Potassium: 3.7 mmol/L (ref 3.5–5.1)
Sodium: 142 mmol/L (ref 135–145)
TCO2: 25 mmol/L (ref 22–32)

## 2021-01-11 LAB — RESP PANEL BY RT-PCR (FLU A&B, COVID) ARPGX2
Influenza A by PCR: NEGATIVE
Influenza B by PCR: NEGATIVE
SARS Coronavirus 2 by RT PCR: NEGATIVE

## 2021-01-11 LAB — DIFFERENTIAL
Abs Immature Granulocytes: 0.02 10*3/uL (ref 0.00–0.07)
Basophils Absolute: 0.1 10*3/uL (ref 0.0–0.1)
Basophils Relative: 1 %
Eosinophils Absolute: 0.2 10*3/uL (ref 0.0–0.5)
Eosinophils Relative: 2 %
Immature Granulocytes: 0 %
Lymphocytes Relative: 35 %
Lymphs Abs: 2.7 10*3/uL (ref 0.7–4.0)
Monocytes Absolute: 0.6 10*3/uL (ref 0.1–1.0)
Monocytes Relative: 8 %
Neutro Abs: 4.1 10*3/uL (ref 1.7–7.7)
Neutrophils Relative %: 54 %

## 2021-01-11 LAB — COMPREHENSIVE METABOLIC PANEL
ALT: 10 U/L (ref 0–44)
AST: 22 U/L (ref 15–41)
Albumin: 3.6 g/dL (ref 3.5–5.0)
Alkaline Phosphatase: 79 U/L (ref 38–126)
Anion gap: 7 (ref 5–15)
BUN: 6 mg/dL — ABNORMAL LOW (ref 8–23)
CO2: 24 mmol/L (ref 22–32)
Calcium: 9 mg/dL (ref 8.9–10.3)
Chloride: 107 mmol/L (ref 98–111)
Creatinine, Ser: 0.82 mg/dL (ref 0.61–1.24)
GFR, Estimated: >60 mL/min (ref >60)
Glucose, Bld: 93 mg/dL (ref 70–99)
Potassium: 4 mmol/L (ref 3.5–5.1)
Sodium: 138 mmol/L (ref 135–145)
Total Bilirubin: 1.4 mg/dL — ABNORMAL HIGH (ref 0.3–1.2)
Total Protein: 6.3 g/dL — ABNORMAL LOW (ref 6.5–8.1)

## 2021-01-11 LAB — PROTIME-INR
INR: 1 (ref 0.8–1.2)
Prothrombin Time: 12.9 seconds (ref 11.4–15.2)

## 2021-01-11 LAB — APTT: aPTT: 28 seconds (ref 24–36)

## 2021-01-11 SURGERY — CREATION, CRANIAL BURR HOLE
Anesthesia: General | Site: Head | Laterality: Right

## 2021-01-11 MED ORDER — PROMETHAZINE HCL 25 MG/ML IJ SOLN
6.2500 mg | INTRAMUSCULAR | Status: DC | PRN
Start: 2021-01-11 — End: 2021-01-11

## 2021-01-11 MED ORDER — ARTIFICIAL TEARS OPHTHALMIC OINT
TOPICAL_OINTMENT | OPHTHALMIC | Status: AC
  Filled 2021-01-11: qty 3.5

## 2021-01-11 MED ORDER — FENTANYL CITRATE (PF) 100 MCG/2ML IJ SOLN
INTRAMUSCULAR | Status: AC
  Filled 2021-01-11: qty 2

## 2021-01-11 MED ORDER — HYDROMORPHONE HCL 1 MG/ML IJ SOLN: 0.5000 mg | INTRAMUSCULAR | Status: DC | PRN

## 2021-01-11 MED ORDER — ONDANSETRON HCL 4 MG/2ML IJ SOLN: 4.0000 mg | INTRAMUSCULAR | Status: DC | PRN

## 2021-01-11 MED ORDER — LIDOCAINE 2% (20 MG/ML) 5 ML SYRINGE
INTRAMUSCULAR | Status: AC
  Filled 2021-01-11: qty 5

## 2021-01-11 MED ORDER — SODIUM CHLORIDE 0.9 % IV SOLN: INTRAVENOUS | Status: DC | PRN

## 2021-01-11 MED ORDER — THROMBIN (RECOMBINANT) 5000 UNITS EX SOLR
CUTANEOUS | Status: AC
  Filled 2021-01-11: qty 5000

## 2021-01-11 MED ORDER — 0.9 % SODIUM CHLORIDE (POUR BTL) OPTIME
TOPICAL | Status: DC | PRN
  Administered 2021-01-11 (×2): 1000 mL

## 2021-01-11 MED ORDER — LIDOCAINE-EPINEPHRINE 1 %-1:100000 IJ SOLN
INTRAMUSCULAR | Status: AC
  Filled 2021-01-11: qty 1

## 2021-01-11 MED ORDER — ACETAMINOPHEN 325 MG PO TABS
650.0000 mg | ORAL_TABLET | ORAL | Status: DC | PRN
  Administered 2021-01-11 – 2021-01-13 (×2): 650 mg via ORAL
  Filled 2021-01-11 (×2): qty 2

## 2021-01-11 MED ORDER — ONDANSETRON HCL 4 MG/2ML IJ SOLN
INTRAMUSCULAR | Status: AC
  Filled 2021-01-11: qty 2

## 2021-01-11 MED ORDER — ROCURONIUM BROMIDE 10 MG/ML (PF) SYRINGE
PREFILLED_SYRINGE | INTRAVENOUS | Status: DC | PRN
  Administered 2021-01-11: 40 mg via INTRAVENOUS

## 2021-01-11 MED ORDER — FENTANYL CITRATE (PF) 250 MCG/5ML IJ SOLN
INTRAMUSCULAR | Status: DC | PRN
  Administered 2021-01-11: 50 ug via INTRAVENOUS

## 2021-01-11 MED ORDER — DEXAMETHASONE SODIUM PHOSPHATE 10 MG/ML IJ SOLN
INTRAMUSCULAR | Status: AC
  Filled 2021-01-11: qty 1

## 2021-01-11 MED ORDER — PROPOFOL 10 MG/ML IV BOLUS
INTRAVENOUS | Status: DC | PRN
  Administered 2021-01-11: 20 mg via INTRAVENOUS
  Administered 2021-01-11: 100 mg via INTRAVENOUS

## 2021-01-11 MED ORDER — SUCCINYLCHOLINE CHLORIDE 200 MG/10ML IV SOSY
PREFILLED_SYRINGE | INTRAVENOUS | Status: DC | PRN
  Administered 2021-01-11: 80 mg via INTRAVENOUS

## 2021-01-11 MED ORDER — DEXAMETHASONE SODIUM PHOSPHATE 10 MG/ML IJ SOLN
INTRAMUSCULAR | Status: DC | PRN
  Administered 2021-01-11: 5 mg via INTRAVENOUS

## 2021-01-11 MED ORDER — POLYETHYLENE GLYCOL 3350 17 G PO PACK: 17.0000 g | PACK | Freq: Every day | ORAL | Status: DC | PRN

## 2021-01-11 MED ORDER — SUGAMMADEX SODIUM 200 MG/2ML IV SOLN
INTRAVENOUS | Status: DC | PRN
  Administered 2021-01-11: 200 mg via INTRAVENOUS

## 2021-01-11 MED ORDER — THROMBIN 5000 UNITS EX SOLR
OROMUCOSAL | Status: DC | PRN
  Administered 2021-01-11: 5 mL via TOPICAL

## 2021-01-11 MED ORDER — HYDROCODONE-ACETAMINOPHEN 5-325 MG PO TABS
1.0000 | ORAL_TABLET | ORAL | Status: DC | PRN
  Administered 2021-01-12 (×2): 1 via ORAL
  Filled 2021-01-11 (×2): qty 1

## 2021-01-11 MED ORDER — CEFAZOLIN SODIUM 1 G IJ SOLR
INTRAMUSCULAR | Status: AC
  Filled 2021-01-11: qty 20

## 2021-01-11 MED ORDER — ACETAMINOPHEN 650 MG RE SUPP: 650.0000 mg | RECTAL | Status: DC | PRN

## 2021-01-11 MED ORDER — PROPOFOL 10 MG/ML IV BOLUS
INTRAVENOUS | Status: AC
  Filled 2021-01-11: qty 20

## 2021-01-11 MED ORDER — PROMETHAZINE HCL 25 MG PO TABS: 12.5000 mg | ORAL_TABLET | ORAL | Status: DC | PRN

## 2021-01-11 MED ORDER — ROCURONIUM BROMIDE 10 MG/ML (PF) SYRINGE
PREFILLED_SYRINGE | INTRAVENOUS | Status: AC
  Filled 2021-01-11: qty 10

## 2021-01-11 MED ORDER — MEPERIDINE HCL 25 MG/ML IJ SOLN: 6.2500 mg | INTRAMUSCULAR | Status: DC | PRN

## 2021-01-11 MED ORDER — ONDANSETRON HCL 4 MG PO TABS: 4.0000 mg | ORAL_TABLET | ORAL | Status: DC | PRN

## 2021-01-11 MED ORDER — HEPARIN SODIUM (PORCINE) 5000 UNIT/ML IJ SOLN
5000.0000 [IU] | Freq: Three times a day (TID) | INTRAMUSCULAR | Status: DC
  Administered 2021-01-13: 5000 [IU] via SUBCUTANEOUS
  Filled 2021-01-11: qty 1

## 2021-01-11 MED ORDER — BACITRACIN ZINC 500 UNIT/GM EX OINT
TOPICAL_OINTMENT | CUTANEOUS | Status: DC | PRN
  Administered 2021-01-11: 1 via TOPICAL

## 2021-01-11 MED ORDER — CEFAZOLIN SODIUM-DEXTROSE 2-3 GM-%(50ML) IV SOLR
INTRAVENOUS | Status: DC | PRN
  Administered 2021-01-11: 2 g via INTRAVENOUS

## 2021-01-11 MED ORDER — CHLORHEXIDINE GLUCONATE CLOTH 2 % EX PADS
6.0000 | MEDICATED_PAD | Freq: Every day | CUTANEOUS | Status: DC
  Administered 2021-01-12: 6 via TOPICAL

## 2021-01-11 MED ORDER — CEFAZOLIN SODIUM-DEXTROSE 2-4 GM/100ML-% IV SOLN
2.0000 g | Freq: Three times a day (TID) | INTRAVENOUS | Status: AC
  Administered 2021-01-12 (×2): 2 g via INTRAVENOUS
  Filled 2021-01-11 (×2): qty 100

## 2021-01-11 MED ORDER — PHENYLEPHRINE 40 MCG/ML (10ML) SYRINGE FOR IV PUSH (FOR BLOOD PRESSURE SUPPORT)
PREFILLED_SYRINGE | INTRAVENOUS | Status: DC | PRN
  Administered 2021-01-11 (×2): 40 ug via INTRAVENOUS

## 2021-01-11 MED ORDER — LIDOCAINE-EPINEPHRINE 1 %-1:100000 IJ SOLN
INTRAMUSCULAR | Status: DC | PRN
  Administered 2021-01-11: 3 mL via INTRADERMAL

## 2021-01-11 MED ORDER — ALBUTEROL SULFATE (2.5 MG/3ML) 0.083% IN NEBU
2.5000 mg | INHALATION_SOLUTION | Freq: Once | RESPIRATORY_TRACT | Status: AC
  Administered 2021-01-11: 2.5 mg via RESPIRATORY_TRACT

## 2021-01-11 MED ORDER — DOCUSATE SODIUM 100 MG PO CAPS
100.0000 mg | ORAL_CAPSULE | Freq: Two times a day (BID) | ORAL | Status: DC
  Administered 2021-01-12 (×2): 100 mg via ORAL
  Filled 2021-01-11 (×2): qty 1

## 2021-01-11 MED ORDER — BACITRACIN ZINC 500 UNIT/GM EX OINT
TOPICAL_OINTMENT | CUTANEOUS | Status: AC
  Filled 2021-01-11: qty 28.35

## 2021-01-11 MED ORDER — ONDANSETRON HCL 4 MG/2ML IJ SOLN
INTRAMUSCULAR | Status: DC | PRN
  Administered 2021-01-11: 4 mg via INTRAVENOUS

## 2021-01-11 MED ORDER — FENTANYL CITRATE (PF) 250 MCG/5ML IJ SOLN
INTRAMUSCULAR | Status: AC
  Filled 2021-01-11: qty 5

## 2021-01-11 MED ORDER — LIDOCAINE 2% (20 MG/ML) 5 ML SYRINGE
INTRAMUSCULAR | Status: DC | PRN
  Administered 2021-01-11: 80 mg via INTRAVENOUS

## 2021-01-11 MED ORDER — LABETALOL HCL 5 MG/ML IV SOLN: 10.0000 mg | INTRAVENOUS | Status: DC | PRN

## 2021-01-11 MED ORDER — SUCCINYLCHOLINE CHLORIDE 200 MG/10ML IV SOSY
PREFILLED_SYRINGE | INTRAVENOUS | Status: AC
  Filled 2021-01-11: qty 10

## 2021-01-11 MED ORDER — ALBUTEROL SULFATE (2.5 MG/3ML) 0.083% IN NEBU
INHALATION_SOLUTION | RESPIRATORY_TRACT | Status: AC
  Filled 2021-01-11: qty 3

## 2021-01-11 MED ORDER — FENTANYL CITRATE (PF) 100 MCG/2ML IJ SOLN
25.0000 ug | INTRAMUSCULAR | Status: DC | PRN
  Administered 2021-01-11: 50 ug via INTRAVENOUS

## 2021-01-11 SURGICAL SUPPLY — 60 items
BAG COUNTER SPONGE SURGICOUNT (BAG) ×2 IMPLANT
BAG SPNG CNTER NS LX DISP (BAG) ×1
BLADE CLIPPER SURG (BLADE) ×2 IMPLANT
BNDG GAUZE ELAST 4 BULKY (GAUZE/BANDAGES/DRESSINGS) IMPLANT
BUR ACORN 9.0 PRECISION (BURR) ×2 IMPLANT
BUR MATCHSTICK NEURO 3.0 LAGG (BURR) IMPLANT
BUR SPIRAL ROUTER 2.3 (BUR) IMPLANT
CANISTER SUCT 3000ML PPV (MISCELLANEOUS) ×2 IMPLANT
CLIP VESOCCLUDE MED 6/CT (CLIP) IMPLANT
DRAPE NEUROLOGICAL W/INCISE (DRAPES) ×2 IMPLANT
DRAPE SHEET LG 3/4 BI-LAMINATE (DRAPES) ×2 IMPLANT
DRAPE SURG 17X23 STRL (DRAPES) IMPLANT
DRAPE WARM FLUID 44X44 (DRAPES) ×2 IMPLANT
DURAPREP 6ML APPLICATOR 50/CS (WOUND CARE) ×2 IMPLANT
ELECT REM PT RETURN 9FT ADLT (ELECTROSURGICAL) ×2
ELECTRODE REM PT RTRN 9FT ADLT (ELECTROSURGICAL) ×1 IMPLANT
EVACUATOR 1/8 PVC DRAIN (DRAIN) IMPLANT
EVACUATOR SILICONE 100CC (DRAIN) IMPLANT
GAUZE 4X4 16PLY ~~LOC~~+RFID DBL (SPONGE) IMPLANT
GAUZE SPONGE 4X4 12PLY STRL (GAUZE/BANDAGES/DRESSINGS) ×2 IMPLANT
GLOVE EXAM NITRILE LRG STRL (GLOVE) IMPLANT
GLOVE EXAM NITRILE XL STR (GLOVE) IMPLANT
GLOVE EXAM NITRILE XS STR PU (GLOVE) IMPLANT
GLOVE SURG LTX SZ7.5 (GLOVE) ×4 IMPLANT
GLOVE SURG UNDER POLY LF SZ7.5 (GLOVE) ×4 IMPLANT
GOWN STRL REUS W/ TWL LRG LVL3 (GOWN DISPOSABLE) ×2 IMPLANT
GOWN STRL REUS W/ TWL XL LVL3 (GOWN DISPOSABLE) IMPLANT
GOWN STRL REUS W/TWL 2XL LVL3 (GOWN DISPOSABLE) IMPLANT
GOWN STRL REUS W/TWL LRG LVL3 (GOWN DISPOSABLE) ×4
GOWN STRL REUS W/TWL XL LVL3 (GOWN DISPOSABLE)
HEMOSTAT POWDER KIT SURGIFOAM (HEMOSTASIS) ×2 IMPLANT
HEMOSTAT SURGICEL 2X14 (HEMOSTASIS) IMPLANT
HOOK DURA 1/2IN (MISCELLANEOUS) ×1 IMPLANT
KIT BASIN OR (CUSTOM PROCEDURE TRAY) ×2 IMPLANT
KIT TURNOVER KIT B (KITS) ×2 IMPLANT
NEEDLE HYPO 22GX1.5 SAFETY (NEEDLE) ×2 IMPLANT
NS IRRIG 1000ML POUR BTL (IV SOLUTION) ×2 IMPLANT
PACK CRANIOTOMY CUSTOM (CUSTOM PROCEDURE TRAY) ×2 IMPLANT
PATTIES SURGICAL .5 X.5 (GAUZE/BANDAGES/DRESSINGS) IMPLANT
PATTIES SURGICAL .5 X3 (DISPOSABLE) IMPLANT
PATTIES SURGICAL 1X1 (DISPOSABLE) IMPLANT
SPONGE NEURO XRAY DETECT 1X3 (DISPOSABLE) IMPLANT
SPONGE SURGIFOAM ABS GEL SZ50 (HEMOSTASIS) ×1 IMPLANT
STAPLER VISISTAT 35W (STAPLE) ×2 IMPLANT
STOCKINETTE 6  STRL (DRAPES)
STOCKINETTE 6 STRL (DRAPES) IMPLANT
SUT ETHILON 3 0 FSL (SUTURE) IMPLANT
SUT ETHILON 3 0 PS 1 (SUTURE) IMPLANT
SUT MNCRL AB 3-0 PS2 18 (SUTURE) ×2 IMPLANT
SUT NURALON 4 0 TR CR/8 (SUTURE) ×1 IMPLANT
SUT STEEL 0 (SUTURE)
SUT STEEL 0 18XMFL TIE 17 (SUTURE) IMPLANT
SUT VIC AB 0 CT1 18XCR BRD8 (SUTURE) ×1 IMPLANT
SUT VIC AB 0 CT1 8-18 (SUTURE) ×2
TOWEL GREEN STERILE (TOWEL DISPOSABLE) ×2 IMPLANT
TOWEL GREEN STERILE FF (TOWEL DISPOSABLE) ×2 IMPLANT
TRAY FOLEY MTR SLVR 16FR STAT (SET/KITS/TRAYS/PACK) ×2 IMPLANT
TUBE CONNECTING 12X1/4 (SUCTIONS) ×2 IMPLANT
UNDERPAD 30X36 HEAVY ABSORB (UNDERPADS AND DIAPERS) ×2 IMPLANT
WATER STERILE IRR 1000ML POUR (IV SOLUTION) ×2 IMPLANT
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
## 2021-01-11 NOTE — ED Notes (Signed)
Patient is resting comfortably. Family at bedside.  

## 2021-01-11 NOTE — Anesthesia Procedure Notes (Signed)
Procedure Name: Intubation Date/Time: 01/11/2021 9:27 PM Performed by: Mayer Camel, CRNA Pre-anesthesia Checklist: Patient identified, Emergency Drugs available, Suction available and Patient being monitored Patient Re-evaluated:Patient Re-evaluated prior to induction Oxygen Delivery Method: Circle System Utilized Preoxygenation: Pre-oxygenation with 100% oxygen Induction Type: IV induction and Rapid sequence Laryngoscope Size: Miller and 2 Grade View: Grade I Tube type: Oral Tube size: 7.5 mm Number of attempts: 1 Airway Equipment and Method: Stylet Placement Confirmation: ETT inserted through vocal cords under direct vision, positive ETCO2 and breath sounds checked- equal and bilateral Secured at: 22 cm Tube secured with: Tape Dental Injury: Teeth and Oropharynx as per pre-operative assessment
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
## 2021-01-11 NOTE — Anesthesia Postprocedure Evaluation (Signed)
Anesthesia Post Note  Patient: [name]  Procedure(s) Performed: RIGHT BURR HOLES FOR SUBDURAL HEMATOMA (Right: Head)     Patient location during evaluation: PACU Anesthesia Type: General Level of consciousness: sedated and patient cooperative Pain management: pain level controlled Vital Signs Assessment: post-procedure vital signs reviewed and stable Respiratory status: spontaneous breathing Cardiovascular status: stable Anesthetic complications: no   No notable events documented.  Last Vitals:  Vitals:   01/11/21 2220 01/11/21 2230  BP:    Pulse:  77  Resp:  12  Temp: 36.6 C   SpO2:  94%    Last Pain:  Vitals:   01/11/21 2220  TempSrc:   PainSc: 4                  Lewie Loron

## 2021-01-11 NOTE — ED Notes (Signed)
Report called to [place], RN in [place]. Wife at bedside. Pt in NAD. Vitals stable and pt is sleeping
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
## 2021-01-11 NOTE — Transfer of Care (Signed)
Immediate Anesthesia Transfer of Care Note  Patient: [name]  Procedure(s) Performed: RIGHT BURR HOLES FOR SUBDURAL HEMATOMA (Right: Head)  Patient Location: PACU  Anesthesia Type:General  Level of Consciousness: awake, alert  and oriented  Airway & Oxygen Therapy: Patient Spontanous Breathing and Patient connected to face mask oxygen  Post-op Assessment: Report given to RN and Post -op Vital signs reviewed and stable  Post vital signs: Reviewed and stable  Last Vitals:  Vitals Value Taken Time  BP 160/85 01/11/21 2218  Temp    Pulse 93 01/11/21 2220  Resp 19 01/11/21 2220  SpO2 96 % 01/11/21 2220  Vitals shown include unvalidated device data.  Last Pain:  Vitals:   01/11/21 1830  TempSrc:   PainSc: 0-No pain      Patients Stated Pain Goal: 0 (01/11/21 1458)  Complications: No notable events documented.

## 2021-01-11 NOTE — Anesthesia Preprocedure Evaluation (Addendum)
Anesthesia Evaluation  Patient identified by MRN, date of birth, ID band Patient awake    Reviewed: Allergy & Precautions, NPO status , Patient's Chart, lab work & pertinent test results  Airway Mallampati: II  TM Distance: >3 FB Neck ROM: Full    Dental  (+) Edentulous Upper, Partial Lower, Poor Dentition, Dental Advisory Given, Missing   Pulmonary neg pulmonary ROS, Current Smoker,    Pulmonary exam normal breath sounds clear to auscultation       Cardiovascular hypertension, Normal cardiovascular exam Rhythm:Regular Rate:Normal     Neuro/Psych negative neurological ROS     GI/Hepatic Neg liver ROS, GERD  ,  Endo/Other  negative endocrine ROS  Renal/GU negative Renal ROS     Musculoskeletal negative musculoskeletal ROS (+)   Abdominal   Peds  Hematology negative hematology ROS (+)   Anesthesia Other Findings   Reproductive/Obstetrics                            Anesthesia Physical Anesthesia Plan  ASA: 3 and emergent  Anesthesia Plan: General   Post-op Pain Management: Minimal or no pain anticipated   Induction: Intravenous, Rapid sequence and Cricoid pressure planned  PONV Risk Score and Plan: 2 and Ondansetron, Dexamethasone and Treatment may vary due to age or medical condition  Airway Management Planned: Oral ETT  Additional Equipment: None  Intra-op Plan:   Post-operative Plan: Extubation in OR  Informed Consent: I have reviewed the patients History and Physical, chart, labs and discussed the procedure including the risks, benefits and alternatives for the proposed anesthesia with the patient or authorized representative who has indicated his/her understanding and acceptance.     Dental advisory given  Plan Discussed with: CRNA  Anesthesia Plan Comments:        Anesthesia Quick Evaluation
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
## 2021-01-12 ENCOUNTER — Encounter (HOSPITAL_COMMUNITY): Payer: Self-pay | Admitting: Neurological Surgery

## 2021-01-12 ENCOUNTER — Other Ambulatory Visit: Payer: Self-pay

## 2021-01-12 ENCOUNTER — Inpatient Hospital Stay (HOSPITAL_COMMUNITY): Payer: Medicare Other

## 2021-01-12 LAB — URINALYSIS, ROUTINE W REFLEX MICROSCOPIC
Glucose, UA: NEGATIVE mg/dL
Ketones, ur: NEGATIVE mg/dL
Leukocytes,Ua: NEGATIVE
Nitrite: NEGATIVE
Protein, ur: 100 mg/dL — AB
Specific Gravity, Urine: >1.03 — ABNORMAL HIGH (ref 1.005–1.030)
pH: 5.5 (ref 5.0–8.0)

## 2021-01-12 LAB — RAPID URINE DRUG SCREEN, HOSP PERFORMED
Amphetamines: NOT DETECTED
Barbiturates: NOT DETECTED
Benzodiazepines: NOT DETECTED
Cocaine: NOT DETECTED
Opiates: NOT DETECTED
Tetrahydrocannabinol: NOT DETECTED

## 2021-01-12 LAB — URINALYSIS, MICROSCOPIC (REFLEX)
Bacteria, UA: NONE SEEN
RBC / HPF: >50 RBC/hpf (ref 0–5)

## 2021-01-12 LAB — CBC
HCT: 44.2 % (ref 39.0–52.0)
Hemoglobin: 14.6 g/dL (ref 13.0–17.0)
MCH: 28.3 pg (ref 26.0–34.0)
MCHC: 33 g/dL (ref 30.0–36.0)
MCV: 85.7 fL (ref 80.0–100.0)
Platelets: 325 10*3/uL (ref 150–400)
RBC: 5.16 MIL/uL (ref 4.22–5.81)
RDW: 14 % (ref 11.5–15.5)
WBC: 6.9 10*3/uL (ref 4.0–10.5)
nRBC: 0 % (ref 0.0–0.2)

## 2021-01-12 LAB — CREATININE, SERUM
Creatinine, Ser: 0.92 mg/dL (ref 0.61–1.24)
GFR, Estimated: >60 mL/min (ref >60)

## 2021-01-12 LAB — MRSA NEXT GEN BY PCR, NASAL: MRSA by PCR Next Gen: NOT DETECTED

## 2021-01-12 LAB — ETHANOL: Alcohol, Ethyl (B): <10 mg/dL (ref <10)

## 2021-01-12 MED FILL — Thrombin (Recombinant) For Soln 5000 Unit: CUTANEOUS | Qty: 5000 | Status: AC

## 2021-01-12 NOTE — Progress Notes (Signed)
Pt arrived to unit transfer from 4NICU. Pt alert and oriented X4, no complaints of pain and VS stable. Pt walked from wheel chair to bed contact guard. Patient resting comfortably call bell in reach and bed in lowest position.
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
## 2021-01-12 NOTE — Progress Notes (Signed)
Neurosurgery Service Progress Note  Subjective: No acute events overnight, headaches and LLE improved from preop   Objective: Vitals:   01/12/21 0500 01/12/21 0600 01/12/21 0609 01/12/21 0700  BP:   (!) 111/51 136/65  Pulse: 61 64 64 61  Resp: 13     Temp:      TempSrc:      SpO2: 95% 97% 95% 95%  Weight:      Height:        Physical Exam: AOx3, PERRL, EOMI, FS, TM, Strength 5/5 x4, SILTx4, incision c/d/i  Assessment & Plan: 70 y.o. man w/ LLE weakness, CTH w/ large R subacute SDH s/p burr hole evac, post-op CTH w/ good evacuation, recovering well.  -transfer to regular floor bed, PT/OT, likely discharge 12/3 if doing well  Jadene Pierini  01/12/21 9:18 AM

## 2021-01-13 MED ORDER — HYDROCODONE-ACETAMINOPHEN 5-325 MG PO TABS: 1.0000 | ORAL_TABLET | Freq: Four times a day (QID) | ORAL | 0 refills | Status: AC | PRN

## 2021-01-13 NOTE — Progress Notes (Signed)
  NEUROSURGERY PROGRESS NOTE   No issues overnight. Minimal HA. Ambulating, tolerating diet, voiding normally  EXAM:  BP (!) 142/77 (BP Location: Left Arm)   Pulse 60   Temp 98.8 F (37.1 C) (Oral)   Resp 16   Ht 5\' 10"  (1.778 m)   Wt 79 kg   SpO2 94%   BMI 24.99 kg/m   Awake, alert, oriented  Speech fluent, appropriate  CN grossly intact  5/5 BUE/BLE  Wound c/d/I  IMPRESSION:  70 y.o. male POD#2 evac cSDH, doing well  PLAN: - d/c home today   61, MD [facility]

## 2021-01-13 NOTE — Discharge Summary (Signed)
  Physician Discharge Summary  Patient ID: [name] MRN: [id number] DOB/AGE: [birth year] 70 y.o.  Admit date: 01/11/2021 Discharge date: 01/13/2021  Admission Diagnoses:  Subdural hematoma  Discharge Diagnoses:  Same Principal Problem:   Subdural hematoma   Discharged Condition: Stable  Hospital Course:  [name] is a 70 y.o. male who underwent evacuation of right sdh. He was at baseline postoperatively, monitored on the floor. He was ambulating well, tolerating diet, and voiding normally and requested d/c.  Treatments: Surgery - evacuation of right SDH  Discharge Exam: Blood pressure (!) 142/77, pulse 60, temperature 98.8 F (37.1 C), temperature source Oral, resp. rate 16, height 5\' 10"  (1.778 m), weight 79 kg, SpO2 94 %. Awake, alert, oriented Speech fluent, appropriate CN grossly intact 5/5 BUE/BLE Wound c/d/i  Disposition: Discharge disposition: 01-Home or Self Care       Discharge Instructions     Call MD for:  redness, tenderness, or signs of infection (pain, swelling, redness, odor or green/yellow discharge around incision site)   Complete by: As directed    Call MD for:  temperature >100.4   Complete by: As directed    Diet - low sodium heart healthy   Complete by: As directed    Discharge instructions   Complete by: As directed    Walk at home as much as possible, at least 4 times / day   Increase activity slowly   Complete by: As directed    Lifting restrictions   Complete by: As directed    No lifting > 10 lbs   May shower / Bathe   Complete by: As directed    48 hours after surgery   May walk up steps   Complete by: As directed    No dressing needed   Complete by: As directed    Other Restrictions   Complete by: As directed    No bending/twisting at waist      Allergies as of 01/13/2021   No Known Allergies      Medication List     STOP taking these medications    aspirin 500 MG tablet       TAKE these  medications    acetaminophen 500 MG tablet Commonly known as: TYLENOL Take 1,000 mg by mouth every 6 (six) hours as needed for moderate pain.   atorvastatin 40 MG tablet Commonly known as: LIPITOR Take 1 tablet (40 mg total) by mouth daily.   fluticasone 50 MCG/ACT nasal spray Commonly known as: FLONASE Place 2 sprays into both nostrils daily.   HYDROcodone-acetaminophen 5-325 MG tablet Commonly known as: NORCO/VICODIN Take 1 tablet by mouth every 6 (six) hours as needed for up to 7 days for moderate pain.               Discharge Care Instructions  (From admission, onward)           Start     Ordered   01/13/21 0000  No dressing needed        01/13/21 0730            Follow-up Information     14/03/22, MD Follow up in 2 week(s).   Specialty: Neurosurgery Contact information: [address] [place] [phone]                 Signed: [phone] 01/13/2021, 7:30 AM
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
## 2021-06-05 NOTE — Assessment & Plan Note (Addendum)
No documented medications with chronic cough. Will start omeprazole for GERD treatment.  ?

## 2021-06-05 NOTE — Assessment & Plan Note (Addendum)
No documented medications on list. Multiple episodes of elevated BP, has taken HCTZ in the past. Will continue with HCTZ 12.5 mg daily again.  ?

## 2021-06-05 NOTE — Assessment & Plan Note (Addendum)
Last lipid panel 06/2020: LDL 18. Lipitor 40 mg started at that time but patient stopped taking. Will recheck lipid panel and restart Lipitor if indicated.   ?

## 2021-06-05 NOTE — Assessment & Plan Note (Signed)
Referral placed 06/2020 to [facility] ? ?

## 2021-06-05 NOTE — Assessment & Plan Note (Addendum)
Per last CT 06/2020: "Lung-RADS 3S, probably benign findings. Short-term follow-up in 6 ?months is recommended with repeat low-dose chest CT without contrast." Past time for follow up, ordered repeat CT scan. Approached option of smoking cessation, if he would like to on next visit, can schedule smoking cessation with Dr. Raymondo Band.  ?

## 2021-06-06 ENCOUNTER — Ambulatory Visit (INDEPENDENT_AMBULATORY_CARE_PROVIDER_SITE_OTHER): Payer: Medicare Other | Admitting: Student

## 2021-06-06 ENCOUNTER — Encounter: Payer: Self-pay | Admitting: Student

## 2021-06-06 ENCOUNTER — Other Ambulatory Visit: Payer: Self-pay

## 2021-06-06 VITALS — BP 155/82 | HR 74 | Wt 167.4 lb

## 2021-06-06 DIAGNOSIS — F17210 Nicotine dependence, cigarettes, uncomplicated: Secondary | ICD-10-CM

## 2021-06-06 DIAGNOSIS — K219 Gastro-esophageal reflux disease without esophagitis: Secondary | ICD-10-CM

## 2021-06-06 DIAGNOSIS — Z23 Encounter for immunization: Secondary | ICD-10-CM | POA: Diagnosis not present

## 2021-06-06 DIAGNOSIS — I10 Essential (primary) hypertension: Secondary | ICD-10-CM | POA: Diagnosis not present

## 2021-06-06 DIAGNOSIS — R911 Solitary pulmonary nodule: Secondary | ICD-10-CM | POA: Diagnosis not present

## 2021-06-06 DIAGNOSIS — H9193 Unspecified hearing loss, bilateral: Secondary | ICD-10-CM | POA: Diagnosis not present

## 2021-06-06 DIAGNOSIS — R053 Chronic cough: Secondary | ICD-10-CM

## 2021-06-06 DIAGNOSIS — Z1211 Encounter for screening for malignant neoplasm of colon: Secondary | ICD-10-CM

## 2021-06-06 DIAGNOSIS — E7800 Pure hypercholesterolemia, unspecified: Secondary | ICD-10-CM | POA: Diagnosis not present

## 2021-06-06 DIAGNOSIS — Z0000 Encounter for general adult medical examination without abnormal findings: Secondary | ICD-10-CM

## 2021-06-06 DIAGNOSIS — T7840XD Allergy, unspecified, subsequent encounter: Secondary | ICD-10-CM | POA: Diagnosis not present

## 2021-06-06 MED ORDER — ZOSTER VAC RECOMB ADJUVANTED 50 MCG/0.5ML IM SUSR: 0.5000 mL | Freq: Once | INTRAMUSCULAR | 0 refills | Status: AC

## 2021-06-06 MED ORDER — FLUTICASONE PROPIONATE 50 MCG/ACT NA SUSP: 2.0000 | Freq: Every day | NASAL | 0 refills | Status: DC

## 2021-06-06 MED ORDER — OMEPRAZOLE 40 MG PO CPDR: 40.0000 mg | DELAYED_RELEASE_CAPSULE | Freq: Every day | ORAL | 0 refills | Status: DC

## 2021-06-06 MED ORDER — HYDROCHLOROTHIAZIDE 12.5 MG PO TABS: 12.5000 mg | ORAL_TABLET | Freq: Every day | ORAL | 0 refills | Status: DC

## 2021-06-06 NOTE — Assessment & Plan Note (Signed)
Differential includes chronic smoking induced COPD, GERD, seasonal allergies, post nasal drip. Updated CT ordered, omeprazole rx for GERD, Flonase for allergies rx.  ?

## 2021-06-06 NOTE — Patient Instructions (Addendum)
It was great to see you today! Thank you for choosing [facility] for your primary care. [name] was seen for annual physical  ? ?Today we addressed: ?Your current medications and updated your problem list ?I have ordered a prescription for Flonase, Omeprazole, and HCTZ  ?Please monitor blood pressure ?I have referred you to the audiologist for hearing testing  ?I have also ordered labs for today  ?CT of your chest was ordered, please go to [facility] to have this done  ? ? ?Orders Placed This Encounter  ?Procedures  ? CT CHEST NODULE FOLLOW UP LOW DOSE W/O  ?  Standing Status:   Future  ?  Standing Expiration Date:   06/06/2022  ?  Order Specific Question:   Preferred imaging location?  ?  Answer:   [facility]  ? Lipid panel  ? Ambulatory referral to Audiology  ?  Referral Priority:   Routine  ?  Referral Type:   Audiology Exam  ?  Referral Reason:   Specialty Services Required  ?  Number of Visits Requested:   1  ? ?Meds ordered this encounter  ?Medications  ? Zoster Vaccine Adjuvanted [facility]) injection  ?  Sig: Inject 0.5 mLs into the muscle once for 1 dose.  ?  Dispense:  0.5 mL  ?  Refill:  0  ? omeprazole (PRILOSEC) 40 MG capsule  ?  Sig: Take 1 capsule (40 mg total) by mouth daily.  ?  Dispense:  30 capsule  ?  Refill:  0  ? hydrochlorothiazide (HYDRODIURIL) 12.5 MG tablet  ?  Sig: Take 1 tablet (12.5 mg total) by mouth daily.  ?  Dispense:  30 tablet  ?  Refill:  0  ? fluticasone (FLONASE) 50 MCG/ACT nasal spray  ?  Sig: Place 2 sprays into both nostrils daily.  ?  Dispense:  16 g  ?  Refill:  0  ? ? ?We are checking some labs today. If they are abnormal, I will call you. If they are normal, I will send you a MyChart message (if it is active) or a letter in the mail. If you do not hear about your labs in the next 2 weeks, please call the office. ? ?You should return to our clinic No follow-ups on file.. ? ?I recommend that you always bring your medications to each appointment as  this makes it easy to ensure you are on the correct medications and helps [place] not miss refills when you need them. ? ?Please arrive 15 minutes before your appointment to ensure smooth check in process.  We appreciate your efforts in making this happen. ? ?Take care and seek immediate care sooner if you develop any concerns.  ? ?Thank you for allowing me to participate in your care, ?Tiamarie Furnari  ?

## 2021-06-07 LAB — LIPID PANEL
Chol/HDL Ratio: 5.3 ratio — ABNORMAL HIGH (ref 0.0–5.0)
Cholesterol, Total: 230 mg/dL — ABNORMAL HIGH (ref 100–199)
HDL: 43 mg/dL (ref >39)
LDL Chol Calc (NIH): 171 mg/dL — ABNORMAL HIGH (ref 0–99)
Triglycerides: 92 mg/dL (ref 0–149)
VLDL Cholesterol Cal: 16 mg/dL (ref 5–40)

## 2021-06-10 ENCOUNTER — Telehealth: Payer: Self-pay | Admitting: Student

## 2021-06-10 NOTE — Telephone Encounter (Addendum)
[facility] Telephone Note ? ?ASCVD risk 46% of cardiovascular event in next 10 years. Would highly recommend restarting Lipitor 40 mg, noncompliant with medications in past. Will further discuss smoking cessation and encourage visit with Dr. Valentina Lucks for smoking cessation. Encourage heart healthy diet as well. Repeat lipid panel in 3 months.  ? ?Called patient and left HIPPA compliant VM. Will attempt to call again.  ? ?[id number]: Attempted second call and left HIPPA compliant VM. Will attempt again and then send letter if indicated.  ? ?Erskine Emery, MD ?06/10/2021, 3:56 PM ?PGY-1 ?

## 2021-06-11 ENCOUNTER — Telehealth: Payer: Self-pay | Admitting: *Deleted

## 2021-06-11 NOTE — Telephone Encounter (Signed)
Attempted to call pt Friday and today to inform him of his CT appt. No answer. Left VM. Delray Alt, CMA  ?

## 2021-06-12 ENCOUNTER — Ambulatory Visit (HOSPITAL_COMMUNITY): Payer: Medicare Other

## 2021-06-13 ENCOUNTER — Ambulatory Visit (HOSPITAL_COMMUNITY)
Admission: RE | Admit: 2021-06-13 | Discharge: 2021-06-13 | Disposition: A | Discharge status: Home / Self Care | Payer: Medicare Other | Source: Ambulatory Visit | Attending: Family Medicine | Admitting: Family Medicine

## 2021-06-13 ENCOUNTER — Other Ambulatory Visit: Payer: Self-pay | Admitting: Student

## 2021-06-13 DIAGNOSIS — R911 Solitary pulmonary nodule: Secondary | ICD-10-CM | POA: Insufficient documentation

## 2021-06-13 DIAGNOSIS — J439 Emphysema, unspecified: Secondary | ICD-10-CM | POA: Diagnosis not present

## 2021-06-13 NOTE — Progress Notes (Signed)
Lab Result Follow Up:  ? ?Attempted to call patient multiple times, left HIPPA compliant VM. Will send letter about lipid panel and send in statin.  ? ? ?Alfredo Martinez PGY-1 ?

## 2021-06-14 ENCOUNTER — Other Ambulatory Visit: Payer: Self-pay | Admitting: Student

## 2021-06-15 ENCOUNTER — Other Ambulatory Visit: Payer: Self-pay | Admitting: Student

## 2021-06-15 NOTE — Progress Notes (Signed)
Unable to reach patient via telephone after multiple attempts. Called in prescription for Lipitor to his pharmacy and sent letter outlining plan.  ? ? ?[name]  ?

## 2021-06-26 NOTE — Progress Notes (Signed)
     SUBJECTIVE:   CHIEF COMPLAINT / HPI:   HTN: Restarted HCTZ recently 12.5 mg daily. Patient remains asymptomatic. Patient is breathing well without chest pain, no HA or vision changes.   Lung Nodule on CT scan: Received CT scan on 06/13/21, not read yet by radiology without images to view.   Smoking 1.5 PPD for over 40 years. He has thought about quitting in the past, has stopped for 3 months in the past. He would like to stop smoking but acknowledges how difficult it would be. He has used patches without success.   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   Encounter for screening colonoscopy for non-high-risk patient 06/28/2019   Lung nodule seen on imaging study 06/28/2019   Smoking greater than 40 pack years 03/01/2019   Cough 04/16/2018   Cigarette nicotine dependence without complication [id number]   GERD 08/23/2008   HYPERCHOLESTEROLEMIA 04/10/2006   HYPERTENSION, BENIGN SYSTEMIC 04/10/2006      06/29/2021    2:12 PM 07/03/2020    8:28 AM 06/19/2020    9:16 AM  PHQ9 SCORE ONLY  PHQ-9 Total Score 0 0 0   OBJECTIVE:   BP 135/80   Pulse 72   Ht 5\' 10"  (1.778 m)   Wt 166 lb 3.2 oz (75.4 kg)   SpO2 100%   BMI 23.85 kg/m   General: Alert and oriented in no apparent distress Heart: Regular rate and rhythm with no murmurs appreciated Lungs: CTA bilaterally, scant/scattered expiratory wheezes with good air movement and normal WOB Abdomen: Bowel sounds present, no abdominal pain Skin: Warm and dry   ASSESSMENT/PLAN:   HYPERTENSION, BENIGN SYSTEMIC Continue HCTZ at current dosage as he is within range at 135/80. Work on low salt diet, patient exercises on bike regularly.   Lung nodule seen on imaging study Called radiology dept at 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 They took my  phone number but I did not receive a phone call during or after work hours. Will call again on 07/02/21 for updates on the radiologic read and/or images of the study.   GERD Stable on PPI  Smoking greater than 40 pack years We discussed that smoking cessation was imperative for the patient. He would like to try to quit smoking, I have set up an appt with Dr. Valentina Lucks for smoking cessation assistance.        Erskine Emery, MD [place]

## 2021-06-29 ENCOUNTER — Encounter: Payer: Self-pay | Admitting: Student

## 2021-06-29 ENCOUNTER — Ambulatory Visit (INDEPENDENT_AMBULATORY_CARE_PROVIDER_SITE_OTHER): Payer: Medicare Other | Admitting: Student

## 2021-06-29 VITALS — BP 135/80 | HR 72 | Ht 70.0 in | Wt 166.2 lb

## 2021-06-29 DIAGNOSIS — T7840XD Allergy, unspecified, subsequent encounter: Secondary | ICD-10-CM

## 2021-06-29 DIAGNOSIS — K219 Gastro-esophageal reflux disease without esophagitis: Secondary | ICD-10-CM | POA: Diagnosis not present

## 2021-06-29 DIAGNOSIS — I10 Essential (primary) hypertension: Secondary | ICD-10-CM | POA: Diagnosis not present

## 2021-06-29 DIAGNOSIS — R911 Solitary pulmonary nodule: Secondary | ICD-10-CM | POA: Diagnosis not present

## 2021-06-29 DIAGNOSIS — F17210 Nicotine dependence, cigarettes, uncomplicated: Secondary | ICD-10-CM | POA: Diagnosis not present

## 2021-06-29 MED ORDER — FLUTICASONE PROPIONATE 50 MCG/ACT NA SUSP: 2.0000 | Freq: Every day | NASAL | 0 refills | Status: DC

## 2021-06-29 NOTE — Patient Instructions (Addendum)
It was great to see you today! Thank you for choosing [facility] for your primary care. [name] was seen for .  Today we addressed: Stopping smoking for improvement of your health Keep taking your medications  I will call you with the CT scan results Please follow up at this clinic on 07/23/21 at 9AM to talk about options to quit smoking with Dr. Raymondo Band    No orders of the defined types were placed in this encounter.  No orders of the defined types were placed in this encounter.   We are checking some labs today. If they are abnormal, I will call you. If they are normal, I will send you a MyChart message (if it is active) or a letter in the mail. If you do not hear about your labs in the next 2 weeks, please call the office.  You should return to our clinic Return in about 3 months (around 09/29/2021) for Blood pressure.  I recommend that you always bring your medications to each appointment as this makes it easy to ensure you are on the correct medications and helps [place] not miss refills when you need them.  Please arrive 15 minutes before your appointment to ensure smooth check in process.  We appreciate your efforts in making this happen.  Take care and seek immediate care sooner if you develop any concerns.   Thank you for allowing me to participate in your care  Tobacco use is damaging to your body. It increases your risk of stroke, heart attack, lung cancer, and serious lung disease in the future. It also reduces your fertility.   Quitting tobacco is the best thing for your health but is a challenge---nicotine, a chemical in cigarettes, is highly addictive.   You can call 1 800 QUIT NOW ([phone])---you will be connected with a Careers information officer. They can also mail you nicotine gums, lozenges, and patches to quit.   Ask me about patches (which you wear all day) and gums (which you use when you have a craving) to help you quit.   There are safe, effective  medications to help you quit--  Varencline---also called Chantix---- is the most common medication used to help people stop smoking. It starts a low dose and is increased. I recommended choosing a quit date then starting the medication 8 days before this. Side effects include mild headache, difficulty sleeping, and odd dreams. The medication is typically very well tolerated.   Bupropion---also called Zyban---- is started 1 week before your quit date. You take 1 pill for three days then increase to 1 pill twice per day. Side effects include a mild headache and anxiety---this usually goes away. Some patients experience weight loss.

## 2021-07-01 NOTE — Assessment & Plan Note (Signed)
Stable on PPI 

## 2021-07-01 NOTE — Assessment & Plan Note (Signed)
Continue HCTZ at current dosage as he is within range at 135/80. Work on low salt diet, patient exercises on bike regularly.

## 2021-07-01 NOTE — Assessment & Plan Note (Signed)
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 They took my phone number but I did not receive a phone call during or after work hours. Will call again on 07/02/21 for updates on the radiologic read and/or images of the study.

## 2021-07-01 NOTE — Assessment & Plan Note (Signed)
We discussed that smoking cessation was imperative for the patient. He would like to try to quit smoking, I have set up an appt with Dr. Raymondo Band for smoking cessation assistance.

## 2021-07-03 ENCOUNTER — Telehealth: Payer: Self-pay | Admitting: Student

## 2021-07-03 NOTE — Telephone Encounter (Signed)
Called [facility] again today for assistance as patient has not had CT scan result or images, received scan on 06/13/21. Was told that IT would reach me regarding this patient again but have yet to receive a call back.    Alfredo Martinez, MD  FM PGY1

## 2021-07-04 ENCOUNTER — Other Ambulatory Visit: Payer: Self-pay | Admitting: Student

## 2021-07-04 DIAGNOSIS — R9389 Abnormal findings on diagnostic imaging of other specified body structures: Secondary | ICD-10-CM

## 2021-07-04 NOTE — Progress Notes (Signed)
CT CHEST NODULE FOLLOW UP LOW DOSE W/O  Result Date: 07/03/2021 CLINICAL DATA:  Lung nodule EXAM: CT CHEST WITHOUT CONTRAST TECHNIQUE: Multidetector CT imaging of the chest was performed following the standard protocol without IV contrast. RADIATION DOSE REDUCTION: This exam was performed according to the departmental dose-optimization program which includes automated exposure control, adjustment of the mA and/or kV according to patient size and/or use of iterative reconstruction technique. COMPARISON:  Lung cancer screening CT dated Jun 13, 2021 FINDINGS: Cardiovascular: Heart size. No pericardial effusion. Moderate three-vessel coronary artery calcifications. Mild atherosclerotic disease of the thoracic aorta. Mediastinum/Nodes: Patulous esophagus. Enlarged and heterogeneous thyroid lobe, unchanged when compared to prior exam. No pathologically enlarged lymph nodes seen in the chest. Lungs/Pleura: Central airways are patent. Centrilobular emphysema. No consolidation, pleural effusion or pneumothorax. Interval resolution of left lower lobe pulmonary nodule. No new or enlarging pulmonary nodules. Upper Abdomen: No acute abnormality. Musculoskeletal: No chest wall mass or suspicious bone lesions identified. IMPRESSION: 1. Interval resolution of left lower lobe pulmonary nodule. No new or enlarging pulmonary nodules. 2. Unchanged enlarged heterogeneous thyroid lobe, recommend thyroid ultrasound if not previously performed. 3. Moderate three-vessel coronary artery calcifications. 4. Aortic Atherosclerosis (ICD10-I70.0) and Emphysema (ICD10-J43.9). Electronically Signed   By: Yetta Glassman M.D.   On: 07/03/2021 16:56     Heterogenous thyroid lobe, will check TSH to evaluate function prior to ultrasound if indicated. Future TSH placed. Will have blue team call for lab visit.   Erskine Emery, MD

## 2021-07-05 ENCOUNTER — Ambulatory Visit: Payer: Medicare Other | Attending: Family Medicine | Admitting: Audiologist

## 2021-07-05 ENCOUNTER — Other Ambulatory Visit: Payer: Medicare Other

## 2021-07-05 DIAGNOSIS — H903 Sensorineural hearing loss, bilateral: Secondary | ICD-10-CM | POA: Diagnosis not present

## 2021-07-05 DIAGNOSIS — R9389 Abnormal findings on diagnostic imaging of other specified body structures: Secondary | ICD-10-CM

## 2021-07-05 NOTE — Procedures (Signed)
  Outpatient Audiology and [facility] [address] [place], [place]  [address] [phone]  AUDIOLOGICAL  EVALUATION  NAME: [name]     DOB:   [birth year]      MRN: [id number]                                                                                     DATE: 07/05/2021     REFERENT: Alfredo Martinez, MD STATUS: Outpatient DIAGNOSIS: Sensorineural Hearing Loss Bilateral - Presbycusis    History: [name] was seen for an audiological evaluation. [name] was accompanied to the appointment by his wife.  [name] is receiving a hearing evaluation due to concerns for difficulty hearing for many years now. [name] has difficulty hearing in background noise, crowds, and when his wife is in the other room. [name] is tired of having to yell. This difficulty began gradually. No pain or pressure reported in either ear. Tinnitus present in both ears. 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  Medical history negative for a health condition which is a risk factor for hearing loss. No other relevant case history reported.   Evaluation:  Otoscopy showed a clear view of the tympanic membranes, bilaterally Tympanometry results were consistent with normal middle ear pressure, bilaterally   Audiometric testing was completed using conventional audiometry with supraural transducer. Speech Recognition Thresholds were consistent with pure tone averages, SRT was 50dB in each ear. Word Recognition was excellent at an elevated level of 40dB SL, 100% in the right ear and 96% in the left ear. Pure tone thresholds show mild sloping to severe sensorineural hearing loss in both ears. Test results are consistent with age related changes and noise damage.   Results:  The test results were reviewed with [name]. [name] has a mild sloping to severe sensorineural  hearing loss in both ears. [name] still has great ability to understand words  at a loud level. This makes him a hearing aid candidate. [name] was given the number to call and information about how to obtain hearing aids with is [facility] insurance. [name] had no questions and feels [name] is ready for hearing aids.    Recommendations: Amplification is necessary for both ears. Hearing aids can be purchased from a variety of locations. See provided list for locations in the [place] area.  [name] was given a handout on how to start the process of obtaining hearing aids through his [employer].    21 minutes spent testing and counseling on results.   [name]  Audiologist, Au.D., CCC-A 07/05/2021  9:43 AM  Cc: Alfredo Martinez, MD

## 2021-07-06 ENCOUNTER — Encounter: Payer: Self-pay | Admitting: Student

## 2021-07-06 LAB — TSH: TSH: 0.513 u[IU]/mL (ref 0.450–4.500)

## 2021-07-08 ENCOUNTER — Other Ambulatory Visit: Payer: Self-pay | Admitting: Student

## 2021-07-08 DIAGNOSIS — I10 Essential (primary) hypertension: Secondary | ICD-10-CM

## 2021-07-08 DIAGNOSIS — T7840XD Allergy, unspecified, subsequent encounter: Secondary | ICD-10-CM

## 2021-07-08 DIAGNOSIS — K219 Gastro-esophageal reflux disease without esophagitis: Secondary | ICD-10-CM

## 2021-07-10 ENCOUNTER — Encounter: Payer: Self-pay | Admitting: Student

## 2021-07-10 ENCOUNTER — Other Ambulatory Visit: Payer: Self-pay | Admitting: Student

## 2021-07-10 DIAGNOSIS — R9389 Abnormal findings on diagnostic imaging of other specified body structures: Secondary | ICD-10-CM

## 2021-07-10 NOTE — Progress Notes (Signed)
Attempted to call patient with TSH results. Reassuringly, normal TSH while asymptomatic for thyroid dysfunction. Heterogenous thyroid tissue as incidental finding on CT of chest needs updated U/S for further assessment. Left HIPPA compliant voicemail, will have blue team assist in scheduling ultrasound.

## 2021-07-10 NOTE — Progress Notes (Signed)
Patient will need thyroid U/S for incidental thyroid findings on CT chest. Order placed, will have patient called for scheduling.

## 2021-07-10 NOTE — Progress Notes (Signed)
Patient was contacted by me about TSH and need for Ultrasound. Late note entry. Working with team to get scheduled for patient. He has reported that he will call back and schedule when able.

## 2021-07-16 ENCOUNTER — Inpatient Hospital Stay: Admission: RE | Admit: 2021-07-16 | Payer: Medicare Other | Source: Ambulatory Visit

## 2021-07-23 ENCOUNTER — Ambulatory Visit: Payer: Medicare Other | Admitting: Pharmacist

## 2021-08-27 ENCOUNTER — Encounter: Payer: Self-pay | Admitting: Student

## 2021-08-27 ENCOUNTER — Ambulatory Visit (INDEPENDENT_AMBULATORY_CARE_PROVIDER_SITE_OTHER): Payer: Medicare Other | Admitting: Student

## 2021-08-27 VITALS — BP 111/87 | HR 61 | Resp 18 | Wt 169.1 lb

## 2021-08-27 DIAGNOSIS — E7800 Pure hypercholesterolemia, unspecified: Secondary | ICD-10-CM | POA: Diagnosis not present

## 2021-08-27 DIAGNOSIS — K219 Gastro-esophageal reflux disease without esophagitis: Secondary | ICD-10-CM

## 2021-08-27 DIAGNOSIS — F17210 Nicotine dependence, cigarettes, uncomplicated: Secondary | ICD-10-CM

## 2021-08-27 DIAGNOSIS — E041 Nontoxic single thyroid nodule: Secondary | ICD-10-CM | POA: Diagnosis not present

## 2021-08-27 DIAGNOSIS — I10 Essential (primary) hypertension: Secondary | ICD-10-CM

## 2021-08-27 DIAGNOSIS — Z1211 Encounter for screening for malignant neoplasm of colon: Secondary | ICD-10-CM

## 2021-08-27 MED ORDER — HYDROCHLOROTHIAZIDE 12.5 MG PO TABS: 12.5000 mg | ORAL_TABLET | Freq: Every day | ORAL | 2 refills | Status: DC

## 2021-08-27 MED ORDER — OMEPRAZOLE 40 MG PO CPDR: 40.0000 mg | DELAYED_RELEASE_CAPSULE | Freq: Every day | ORAL | 2 refills | Status: DC

## 2021-08-27 MED ORDER — ATORVASTATIN CALCIUM 40 MG PO TABS: 40.0000 mg | ORAL_TABLET | Freq: Every day | ORAL | 3 refills | Status: DC

## 2021-08-27 NOTE — Assessment & Plan Note (Signed)
Smoking cessation instruction/counseling given:  counseled patient on the dangers of tobacco use, advised patient to stop smoking, and reviewed strategies to maximize success. Rescheduled patient with Dr. Raymondo Band and stressed importance of attending visit

## 2021-08-27 NOTE — Patient Instructions (Addendum)
It was great to see you today! Thank you for choosing [facility] for your primary care. [name] was seen for follow up.  Today we addressed: Continue with you blood pressure medication and Lipitor for cholesterol We need you to be scheduled for a thyroid ultrasound to check a nodule at [facility] 08/29/21 @10 :40AM;  [address] ordered to your house, please follow the instructions   If you haven't already, sign up for My Chart to have easy access to your labs results, and communication with your primary care physician.  You should return to our clinic Return in about 3 months (around 11/27/2021).  I recommend that you always bring your medications to each appointment as this makes it easy to ensure you are on the correct medications and helps 11/29/2021 not miss refills when you need them.  Please arrive 15 minutes before your appointment to ensure smooth check in process.  We appreciate your efforts in making this happen.  Please call the clinic at [phone] if your symptoms worsen or you have any concerns.  Thank you for allowing me to participate in your care, [phone], MD PGY-2 Family Medicine

## 2021-08-27 NOTE — Assessment & Plan Note (Signed)
Reordered Prilosec at patient request.

## 2021-08-27 NOTE — Assessment & Plan Note (Signed)
BP: 111/87 today. Well controlled. Goal met. Continue to work on healthy dietary habits and exercise. Follow up in 3 months. Repeat BMP later in the year.  Medication regimen: HCTZ 12.5 daily

## 2021-08-27 NOTE — Assessment & Plan Note (Signed)
Reordered Lipitor for patient, encourage smoking cessation

## 2021-08-27 NOTE — Assessment & Plan Note (Signed)
Incidental found him lung imaging.  Reschedule thyroid ultrasound as patient missed previous.  TSH normal.

## 2021-08-27 NOTE — Progress Notes (Signed)
  SUBJECTIVE:   CHIEF COMPLAINT / HPI:   Hypertension: BP: 111/87 today. Home medications include: HCTZ. He endorses taking these medications as prescribed until Friday when he ran out. Does not check blood pressure at home. Denies any SOB, CP, vision changes, LE edema, medication SEs, or symptoms of hypotension. Diet: Varied with good amount of vegetables. Exercise: Riding bike regularly. Most recent creatinine trend:  Lab Results  Component Value Date   CREATININE 0.92 01/12/2021   CREATININE 0.70 01/11/2021   CREATININE 0.82 01/11/2021   Patient has had a BMP in the past 1 year.  HLD: Continuing Lipitor 40 mg. Ran out of medication recently on Friday. Some muscle aches intermittently but tolerating them.   PERTINENT  PMH / PSH:   Past Medical History:  Diagnosis Date   Back pain 04/10/2006   Qualifier: Diagnosis of  By: Henderson Baltimore MD, Jessica     Cataract    ERECTILE DYSFUNCTION 10/16/2006   Qualifier: Diagnosis of  By: Jimmye Norman MD, JULIE     GERD (gastroesophageal reflux disease)    Hypertension    Influenza A 04/16/2018   Lung nodule     OBJECTIVE:  BP 111/87   Pulse 61   Resp 18   Wt 169 lb 2 oz (76.7 kg)   SpO2 99%   BMI 24.27 kg/m   General: NAD, pleasant, able to participate in exam; pleasant older AA male  Cardiac: RRR, no murmurs auscultated Respiratory: CTAB, normal WOB Abdomen: soft, non-tender, non-distended,  Extremities: warm and well perfused, no edema or cyanosis Skin: warm and dry, no rashes noted Psych: Normal affect and mood  ASSESSMENT/PLAN:  HYPERTENSION, BENIGN SYSTEMIC BP: 111/87 today. Well controlled. Goal met. Continue to work on healthy dietary habits and exercise. Follow up in 3 months. Repeat BMP later in the year.  Medication regimen: HCTZ 12.5 daily   GERD Reordered Prilosec at patient request.  Smoking greater than 40 pack years Smoking cessation instruction/counseling given:  counseled patient on the dangers of tobacco use,  advised patient to stop smoking, and reviewed strategies to maximize success. Rescheduled patient with Dr. Valentina Lucks and stressed importance of attending visit    Screening for colon cancer Cologuard ordered and discussed.   HYPERCHOLESTEROLEMIA Reordered Lipitor for patient, encourage smoking cessation   Thyroid nodule Incidental found him lung imaging.  Reschedule thyroid ultrasound as patient missed previous.  TSH normal.    Orders Placed This Encounter  Procedures   Cologuard   Meds ordered this encounter  Medications   atorvastatin (LIPITOR) 40 MG tablet    Sig: Take 1 tablet (40 mg total) by mouth daily.    Dispense:  90 tablet    Refill:  3   hydrochlorothiazide (HYDRODIURIL) 12.5 MG tablet    Sig: Take 1 tablet (12.5 mg total) by mouth daily.    Dispense:  30 tablet    Refill:  2   omeprazole (PRILOSEC) 40 MG capsule    Sig: Take 1 capsule (40 mg total) by mouth daily.    Dispense:  30 capsule    Refill:  2   Return in about 3 months (around 11/27/2021). Erskine Emery, MD PGY-2 Family Medicine

## 2021-08-27 NOTE — Assessment & Plan Note (Signed)
Cologuard ordered and discussed.

## 2021-08-29 ENCOUNTER — Ambulatory Visit
Admission: RE | Admit: 2021-08-29 | Discharge: 2021-08-29 | Disposition: A | Discharge status: Home / Self Care | Payer: Medicare Other | Source: Ambulatory Visit | Attending: Family Medicine | Admitting: Family Medicine

## 2021-08-29 DIAGNOSIS — E041 Nontoxic single thyroid nodule: Secondary | ICD-10-CM | POA: Diagnosis not present

## 2021-08-29 DIAGNOSIS — R9389 Abnormal findings on diagnostic imaging of other specified body structures: Secondary | ICD-10-CM

## 2021-08-30 ENCOUNTER — Other Ambulatory Visit: Payer: Self-pay | Admitting: Student

## 2021-08-30 DIAGNOSIS — E041 Nontoxic single thyroid nodule: Secondary | ICD-10-CM

## 2021-08-30 NOTE — Progress Notes (Signed)
Thyroid nodules on ultrasound Order sent to have performed by IR  Will call patient.

## 2021-08-31 NOTE — Progress Notes (Signed)
Called to inform of results and need for biopsy of two of his nodules given their size. The biopsies will be performed by IR at [facility] and he will be called to schedule this. There is no overt concern currently, we just want to check and make sure these are benign   Unable to reach patient and left HIPAA compliant VM to return call. Will call again and then leave letter

## 2021-09-03 ENCOUNTER — Encounter: Payer: Self-pay | Admitting: Student

## 2021-09-07 ENCOUNTER — Other Ambulatory Visit: Payer: Self-pay | Admitting: Family Medicine

## 2021-09-07 DIAGNOSIS — E041 Nontoxic single thyroid nodule: Secondary | ICD-10-CM

## 2021-09-12 ENCOUNTER — Inpatient Hospital Stay: Admission: RE | Admit: 2021-09-12 | Payer: Medicare Other | Source: Ambulatory Visit

## 2021-09-17 ENCOUNTER — Ambulatory Visit: Payer: Medicare Other | Admitting: Pharmacist

## 2021-09-27 ENCOUNTER — Telehealth: Payer: Self-pay | Admitting: Pharmacist

## 2021-09-27 NOTE — Telephone Encounter (Signed)
Noted and agree. 

## 2021-09-27 NOTE — Telephone Encounter (Signed)
Attempted to contact patient for follow-up of tobacco intake reduction / cessation following visit with PCP 08/27/2021.  I have worked with this patient in the past.  He did not keep recent visit nor reschedule.    Left HIPAA compliant voice mail requesting call back to direct phone: [phone]  Total time with patient call and documentation of interaction: 6 minutes.  Additional follow-up phone call planned: 1 week

## 2021-09-28 ENCOUNTER — Ambulatory Visit
Admission: RE | Admit: 2021-09-28 | Discharge: 2021-09-28 | Disposition: A | Discharge status: Home / Self Care | Payer: Medicare Other | Source: Ambulatory Visit | Attending: Family Medicine | Admitting: Family Medicine

## 2021-09-28 ENCOUNTER — Other Ambulatory Visit (HOSPITAL_COMMUNITY)
Admission: RE | Admit: 2021-09-28 | Discharge: 2021-09-28 | Disposition: A | Discharge status: Home / Self Care | Payer: Medicare Other | Source: Ambulatory Visit | Attending: Interventional Radiology | Admitting: Interventional Radiology

## 2021-09-28 DIAGNOSIS — E041 Nontoxic single thyroid nodule: Secondary | ICD-10-CM | POA: Diagnosis not present

## 2021-09-28 DIAGNOSIS — E042 Nontoxic multinodular goiter: Secondary | ICD-10-CM | POA: Diagnosis not present

## 2021-10-01 LAB — CYTOLOGY - NON PAP

## 2021-10-02 ENCOUNTER — Encounter: Payer: Self-pay | Admitting: Student

## 2021-10-03 ENCOUNTER — Other Ambulatory Visit: Payer: Medicare Other

## 2021-10-03 LAB — CYTOLOGY - NON PAP

## 2021-10-18 ENCOUNTER — Ambulatory Visit (INDEPENDENT_AMBULATORY_CARE_PROVIDER_SITE_OTHER): Payer: Medicare Other | Admitting: Family Medicine

## 2021-10-18 VITALS — BP 128/77 | HR 78 | Ht 70.0 in | Wt 164.8 lb

## 2021-10-18 DIAGNOSIS — R011 Cardiac murmur, unspecified: Secondary | ICD-10-CM

## 2021-10-18 DIAGNOSIS — Z20822 Contact with and (suspected) exposure to covid-19: Secondary | ICD-10-CM | POA: Diagnosis not present

## 2021-10-18 NOTE — Progress Notes (Signed)
    SUBJECTIVE:   CHIEF COMPLAINT / HPI:  Chief Complaint  Patient presents with   Covid testing   Shortness of Breath    Patient desires COVID testing as he has been exposed to many people, though no known exposure to COVID.  He denies any symptoms including cough, sore throat, fever, chills, chest pain, shortness of breath.  He reports he does have some baseline cough when smoking and has had ongoing dyspnea after walking 2 blocks.  He requests [place] to call his wife with results of COVID test when resulted.  PERTINENT  PMH / PSH: Tobacco use, GERD  Patient Care Team: Alfredo Martinez, MD as PCP - General (Family Medicine)   OBJECTIVE:   BP 128/77   Pulse 78   Ht 5\' 10"  (1.778 m)   Wt 164 lb 12.8 oz (74.8 kg)   SpO2 98%   BMI 23.65 kg/m   Physical Exam Constitutional:      General: He is not in acute distress. HENT:     Head: Normocephalic and atraumatic.     Mouth/Throat:     Mouth: Mucous membranes are moist.     Pharynx: Oropharynx is clear.  Cardiovascular:     Rate and Rhythm: Normal rate and regular rhythm.     Comments: 2/6 systolic murmur best heard at the right upper sternal border Pulmonary:     Effort: Pulmonary effort is normal. No respiratory distress.     Breath sounds: Normal breath sounds.  Neurological:     Mental Status: He is alert.         10/18/2021    3:10 PM  Depression screen PHQ 2/9  Decreased Interest 0  Down, Depressed, Hopeless 0  PHQ - 2 Score 0  Altered sleeping 0  Tired, decreased energy 0  Change in appetite 0  Feeling bad or failure about yourself  0  Trouble concentrating 0  Moving slowly or fidgety/restless 0  Suicidal thoughts 0  PHQ-9 Score 0  Difficult doing work/chores Not difficult at all     {Show previous vital signs (optional):23777}    ASSESSMENT/PLAN:   Encounter for laboratory testing for COVID-19 virus -testing per patient request, asymptomatic.  Plan: Coronavirus (COVID-19) with Influenza A and Influenza  B  Systolic murmur -no prior history of murmur, will evaluate further with TTE.  Plan: ECHOCARDIOGRAM COMPLETE    Return if symptoms worsen or fail to improve.   4/9, MD [facility]

## 2021-10-18 NOTE — Patient Instructions (Addendum)
It was nice seeing you today!  We will call you with your results.  Stay well, Littie Deeds, MD [facility] [phone]  --  Make sure to check out at the front desk before you leave today.  Please arrive at least 15 minutes prior to your scheduled appointments.  If you had blood work today, I will send you a MyChart message or a letter if results are normal. Otherwise, I will give you a call.  If you had a referral placed, they will call you to set up an appointment. Please give [place] a call if you don't hear back in the next 2 weeks.  If you need additional refills before your next appointment, please call your pharmacy first.

## 2021-10-20 LAB — COVID-19, FLU A+B NAA
Influenza A, NAA: NOT DETECTED
Influenza B, NAA: NOT DETECTED
SARS-CoV-2, NAA: NOT DETECTED

## 2021-10-22 NOTE — Progress Notes (Signed)
Informed pt that his covid/flu test came back negative.

## 2021-10-24 ENCOUNTER — Ambulatory Visit (HOSPITAL_COMMUNITY)
Admission: RE | Admit: 2021-10-24 | Discharge: 2021-10-24 | Disposition: A | Discharge status: Home / Self Care | Payer: Medicare Other | Source: Ambulatory Visit | Attending: Family Medicine | Admitting: Family Medicine

## 2021-10-24 DIAGNOSIS — F17200 Nicotine dependence, unspecified, uncomplicated: Secondary | ICD-10-CM | POA: Diagnosis not present

## 2021-10-24 DIAGNOSIS — I10 Essential (primary) hypertension: Secondary | ICD-10-CM | POA: Insufficient documentation

## 2021-10-24 DIAGNOSIS — R011 Cardiac murmur, unspecified: Secondary | ICD-10-CM | POA: Insufficient documentation

## 2021-10-24 DIAGNOSIS — R0602 Shortness of breath: Secondary | ICD-10-CM | POA: Diagnosis not present

## 2021-10-24 LAB — ECHOCARDIOGRAM COMPLETE
Area-P 1/2: 1.89 cm2
S' Lateral: 2.9 cm

## 2021-10-25 ENCOUNTER — Encounter: Payer: Self-pay | Admitting: Family Medicine

## 2022-02-14 ENCOUNTER — Ambulatory Visit (INDEPENDENT_AMBULATORY_CARE_PROVIDER_SITE_OTHER): Payer: Medicare Other

## 2022-02-14 DIAGNOSIS — Z23 Encounter for immunization: Secondary | ICD-10-CM

## 2022-02-14 NOTE — Progress Notes (Signed)
Patient presents to nurse clinic for flu and COVID vaccinations. See immunization flowsheet.   Tolerated both injections well. Patient observed 15 minutes post COVID injection. No signs of adverse reaction noted.   Talbot Grumbling, RN

## 2022-02-24 ENCOUNTER — Other Ambulatory Visit: Payer: Self-pay | Admitting: Student

## 2022-02-24 DIAGNOSIS — I10 Essential (primary) hypertension: Secondary | ICD-10-CM

## 2022-03-26 ENCOUNTER — Other Ambulatory Visit: Payer: Self-pay | Admitting: Student

## 2022-03-26 DIAGNOSIS — I10 Essential (primary) hypertension: Secondary | ICD-10-CM

## 2022-03-26 NOTE — Telephone Encounter (Signed)
Needs BMP before next refill

## 2022-04-10 ENCOUNTER — Ambulatory Visit (INDEPENDENT_AMBULATORY_CARE_PROVIDER_SITE_OTHER): Payer: Medicare Other | Admitting: Student

## 2022-04-10 ENCOUNTER — Encounter: Payer: Self-pay | Admitting: Student

## 2022-04-10 VITALS — BP 128/77 | HR 85 | Ht 70.0 in | Wt 170.4 lb

## 2022-04-10 DIAGNOSIS — F17210 Nicotine dependence, cigarettes, uncomplicated: Secondary | ICD-10-CM | POA: Diagnosis not present

## 2022-04-10 DIAGNOSIS — E7800 Pure hypercholesterolemia, unspecified: Secondary | ICD-10-CM | POA: Diagnosis not present

## 2022-04-10 DIAGNOSIS — Z1211 Encounter for screening for malignant neoplasm of colon: Secondary | ICD-10-CM

## 2022-04-10 DIAGNOSIS — E041 Nontoxic single thyroid nodule: Secondary | ICD-10-CM

## 2022-04-10 DIAGNOSIS — I10 Essential (primary) hypertension: Secondary | ICD-10-CM

## 2022-04-10 DIAGNOSIS — K219 Gastro-esophageal reflux disease without esophagitis: Secondary | ICD-10-CM

## 2022-04-10 MED ORDER — HYDROCHLOROTHIAZIDE 12.5 MG PO TABS: 12.5000 mg | ORAL_TABLET | Freq: Every day | ORAL | 0 refills | Status: DC

## 2022-04-10 MED ORDER — VARENICLINE TARTRATE (STARTER) 0.5 MG X 11 & 1 MG X 42 PO TBPK: ORAL_TABLET | ORAL | 0 refills | Status: DC

## 2022-04-10 MED ORDER — OMEPRAZOLE 40 MG PO CPDR: 40.0000 mg | DELAYED_RELEASE_CAPSULE | Freq: Every day | ORAL | 2 refills | Status: DC

## 2022-04-10 MED ORDER — ATORVASTATIN CALCIUM 40 MG PO TABS: 40.0000 mg | ORAL_TABLET | Freq: Every day | ORAL | 3 refills | Status: DC

## 2022-04-10 NOTE — Assessment & Plan Note (Addendum)
FNA - Consistent with benign follicular nodule ([place] category II)  - Benign lymphocytes

## 2022-04-10 NOTE — Patient Instructions (Addendum)
It was great to see you today! Thank you for choosing [facility] for your primary care. [name] was seen for follow up.  Today we addressed: You can have strange dreams on Chantix and have a side effect of changing your mood. Please monitor for these symptoms  You can continue to smoke for 1 week after starting chantix, but then must discontinue cigarettes Call with any concerns  I will update you with labwork  Medication refills sent in   Schedule your colonoscopy to help detect colon cancer. The stomach doctors will call to schedule an appt with you. If you decide against this, please ask about the cologuard as an option.  [phone]  If you haven't already, sign up for My Chart to have easy access to your labs results, and communication with your primary care physician.  We are checking some labs today. If they are abnormal, I will call you. If they are normal, I will send you a MyChart message (if it is active) or a letter in the mail. If you do not hear about your labs in the next 2 weeks, please call the office. I recommend that you always bring your medications to each appointment as this makes it easy to ensure you are on the correct medications and helps [place] not miss refills when you need them. Call the clinic at [phone] if your symptoms worsen or you have any concerns.  You should return to our clinic Return in about 3 months (around 07/09/2022). Please arrive 15 minutes before your appointment to ensure smooth check in process.  We appreciate your efforts in making this happen.  Thank you for allowing me to participate in your care, Erskine Emery, MD 04/10/2022, 8:47 AM PGY-2, [place]

## 2022-04-10 NOTE — Progress Notes (Signed)
  SUBJECTIVE:   CHIEF COMPLAINT / HPI:   Hypertension: BP: 128/77 today. Home medications include: HCTZ 12.5 daily . He endorses taking these medications as prescribed. Does not check blood pressure at home. Smokes 6 cigarettes a day, used to smoke a PPD. Interested in starting Chantix, wife has started Chantix and has greatly decreased smoking habit.  Most recent creatinine trend:  Lab Results  Component Value Date   CREATININE 0.92 01/12/2021   CREATININE 0.70 01/11/2021   CREATININE 0.82 01/11/2021   Patient has had a BMP in the past 1 year.  HLD: High ASCVD risk, LDL 171 10 mo ago   PERTINENT  PMH / PSH:  HTN Aortic Atherosclerosis  GERD HLD Smoker   OBJECTIVE:  BP 128/77   Pulse 85   Ht 5' 10"$  (1.778 m)   Wt 170 lb 6 oz (77.3 kg)   SpO2 98%   BMI 24.45 kg/m  Physical Exam  General: Alert and oriented in no apparent distress Heart: Regular rate and rhythm with no murmurs appreciated Lungs: decreased aeration, expiratory wheeze throughout without focal diminishment  Abdomen: Bowel sounds present, no abdominal pain Skin: Warm and dry   ASSESSMENT/PLAN:  Thyroid nodule Assessment & Plan: FNA - Consistent with benign follicular nodule (Bethesda category II)  - Benign lymphocytes    HYPERTENSION, BENIGN SYSTEMIC Assessment & Plan: BP: 128/77 today. Well controlled. Goal of <140/90. Continue to work on healthy dietary habits and exercise. Follow up in 3 months. UTD BMP ordered    Medication regimen: HCTZ 25   Orders: -     Basic metabolic panel -     hydroCHLOROthiazide; Take 1 tablet (12.5 mg total) by mouth daily.  Dispense: 30 tablet; Refill: 0  HYPERCHOLESTEROLEMIA Assessment & Plan: Lipitor ordered, UTD nonfasting lipid panel ordered.  Orders: -     Lipid panel -     Atorvastatin Calcium; Take 1 tablet (40 mg total) by mouth daily.  Dispense: 90 tablet; Refill: 3  Gastroesophageal reflux disease, unspecified whether esophagitis present -      Omeprazole; Take 1 capsule (40 mg total) by mouth daily.  Dispense: 30 capsule; Refill: 2  Smoking greater than 40 pack years -     Varenicline Tartrate (Starter); 0.5 mg/day for 3 days, then 0.5 mg twice a day for 4 days, then 1 mg twice a day  Dispense: 1 each; Refill: 0  Cigarette nicotine dependence without complication Assessment & Plan: Tobacco use: Smoked 40 pack/day and has cut down to 6 cigarettes a day.  Patient was counseled on the risks of tobacco use and cessation strongly encouraged.  Discussed various methods of smoking cessation. Chantix discussed and ordered. Side effects overview, see AVS. Hopeful to stop smoking. Very motivated.   Needs annual lung cancer screen placed at next visit     Screening for colon cancer Assessment & Plan: Provided number to call [place] to set this up    Return in about 3 months (around 07/09/2022). Erskine Emery, MD 04/10/2022, 9:00 AM PGY-2, [place]

## 2022-04-10 NOTE — Assessment & Plan Note (Signed)
BP: 128/77 today. Well controlled. Goal of <140/90. Continue to work on healthy dietary habits and exercise. Follow up in 3 months. UTD BMP ordered    Medication regimen: HCTZ 25

## 2022-04-10 NOTE — Assessment & Plan Note (Signed)
Provided number to call [place] to set this up

## 2022-04-10 NOTE — Assessment & Plan Note (Signed)
Tobacco use: Smoked 40 pack/day and has cut down to 6 cigarettes a day.  Patient was counseled on the risks of tobacco use and cessation strongly encouraged.  Discussed various methods of smoking cessation. Chantix discussed and ordered. Side effects overview, see AVS. Hopeful to stop smoking. Very motivated.   Needs annual lung cancer screen placed at next visit

## 2022-04-10 NOTE — Assessment & Plan Note (Signed)
Lipitor ordered, UTD nonfasting lipid panel ordered.

## 2022-04-11 LAB — LIPID PANEL
Chol/HDL Ratio: 3 ratio (ref 0.0–5.0)
Cholesterol, Total: 127 mg/dL (ref 100–199)
HDL: 42 mg/dL (ref >39)
LDL Chol Calc (NIH): 69 mg/dL (ref 0–99)
Triglycerides: 83 mg/dL (ref 0–149)
VLDL Cholesterol Cal: 16 mg/dL (ref 5–40)

## 2022-04-11 LAB — BASIC METABOLIC PANEL
BUN/Creatinine Ratio: 6 — ABNORMAL LOW (ref 10–24)
BUN: 5 mg/dL — ABNORMAL LOW (ref 8–27)
CO2: 24 mmol/L (ref 20–29)
Calcium: 10 mg/dL (ref 8.6–10.2)
Chloride: 101 mmol/L (ref 96–106)
Creatinine, Ser: 0.89 mg/dL (ref 0.76–1.27)
Glucose: 99 mg/dL (ref 70–99)
Potassium: 3.6 mmol/L (ref 3.5–5.2)
Sodium: 141 mmol/L (ref 134–144)
eGFR: 90 mL/min/{1.73_m2} (ref >59)

## 2022-04-12 ENCOUNTER — Encounter: Payer: Self-pay | Admitting: Student

## 2022-05-25 ENCOUNTER — Other Ambulatory Visit: Payer: Self-pay | Admitting: Student

## 2022-05-25 DIAGNOSIS — I10 Essential (primary) hypertension: Secondary | ICD-10-CM

## 2022-06-26 ENCOUNTER — Other Ambulatory Visit: Payer: Self-pay | Admitting: Student

## 2022-06-26 DIAGNOSIS — I10 Essential (primary) hypertension: Secondary | ICD-10-CM

## 2022-07-09 ENCOUNTER — Ambulatory Visit (INDEPENDENT_AMBULATORY_CARE_PROVIDER_SITE_OTHER): Payer: Medicare Other | Admitting: Student

## 2022-07-09 ENCOUNTER — Encounter: Payer: Self-pay | Admitting: Student

## 2022-07-09 VITALS — BP 131/79 | HR 77 | Ht 70.0 in | Wt 177.0 lb

## 2022-07-09 DIAGNOSIS — J449 Chronic obstructive pulmonary disease, unspecified: Secondary | ICD-10-CM | POA: Diagnosis not present

## 2022-07-09 DIAGNOSIS — F17210 Nicotine dependence, cigarettes, uncomplicated: Secondary | ICD-10-CM | POA: Diagnosis not present

## 2022-07-09 MED ORDER — SPIRIVA RESPIMAT 2.5 MCG/ACT IN AERS: 2.0000 | INHALATION_SPRAY | Freq: Every day | RESPIRATORY_TRACT | 1 refills | Status: DC

## 2022-07-09 MED ORDER — ALBUTEROL SULFATE HFA 108 (90 BASE) MCG/ACT IN AERS: 2.0000 | INHALATION_SPRAY | RESPIRATORY_TRACT | 0 refills | Status: DC | PRN

## 2022-07-09 NOTE — Progress Notes (Signed)
  SUBJECTIVE:   CHIEF COMPLAINT / HPI:   Hypertension: BP: 131/79 today. Home medications include: HCTZ 12.5 daily. He endorses taking these medications as prescribed. Does check blood pressure at home. Eats a good diet.  Patient endorses a constant cough but does not have shortness of breath with ambulation.  He does report that he gets "lazy" and rides his lawnmower around.  Most recent creatinine trend:  Lab Results  Component Value Date   CREATININE 0.89 04/10/2022   CREATININE 0.92 01/12/2021   CREATININE 0.70 01/11/2021   Patient has had a BMP in the past 1 year.  Down to 2 cigarettes a day! Was using half a PPD  PERTINENT  PMH / PSH: aortic atherosclerosis, HTN, GERD, thyroid nodule, HLD, smoker >40 years  Patient Care Team: Alfredo Martinez, MD as PCP - General (Family Medicine) OBJECTIVE:  BP 131/79   Pulse 77   Ht 5\' 10"  (1.778 m)   Wt 177 lb (80.3 kg)   SpO2 96%   BMI 25.40 kg/m  Physical Exam  General: Alert and oriented in no apparent distress Heart: Regular rate and rhythm with no murmurs appreciated Lungs: poor aeration with wheezing throughout  Abdomen: no abdominal pain Skin: Warm and dry  ASSESSMENT/PLAN:  Smoking greater than 40 pack years Assessment & Plan: Annual low-dose CT scan ordered.  Concern for possible COPD Gold A.  Started Spiriva with albuterol as needed.  Will have him follow-up with Dr. Raymondo Band for PFTs.  The patient will make this appointment.  He is to not use his inhalers the day of the appointment.  Orders: -     CT CHEST LUNG CANCER SCREENING LOW DOSE WO CONTRAST; Future  Chronic obstructive pulmonary disease, unspecified COPD type (HCC) Assessment & Plan: Concern for possible COPD Gold A.  Started Spiriva with albuterol as needed.  Will have him follow-up with Dr. Raymondo Band for PFTs.  The patient will make this appointment.  He is to not use his inhalers the day of the appointment.   Orders: -     Spiriva Respimat; Inhale 2 puffs into  the lungs daily.  Dispense: 1 each; Refill: 1 -     Albuterol Sulfate HFA; Inhale 2 puffs into the lungs every 4 (four) hours as needed for wheezing or shortness of breath.  Dispense: 1 each; Refill: 0   Return in about 3 months (around 10/09/2022) for HTN, cough. Alfredo Martinez, MD 07/09/2022, 3:48 PM PGY-2, [facility]

## 2022-07-09 NOTE — Assessment & Plan Note (Addendum)
Annual low-dose CT scan ordered.  Concern for possible COPD Gold A.  Started Spiriva with albuterol as needed.  Will have him follow-up with Dr. Raymondo Band for PFTs.  The patient will make this appointment.  He is to not use his inhalers the day of the appointment.

## 2022-07-09 NOTE — Assessment & Plan Note (Signed)
Concern for possible COPD Gold A.  Started Spiriva with albuterol as needed.  Will have him follow-up with Dr. Raymondo Band for PFTs.  The patient will make this appointment.  He is to not use his inhalers the day of the appointment.

## 2022-07-09 NOTE — Patient Instructions (Signed)
It was great to see you today! Thank you for choosing [facility] for your primary care. [name] was seen for follow up.  Today we addressed: Annual lung cancer screening with low-dose CT to adults aged 74 to 80 years, with a 20 pack-year smoking history, and who currently smoke or have quit within the last 15 years. Screening should be discontinued once a person has not smoked for 15 years, develops a substantially limited life-expectancy, or no longer desires to continue screening.   Please go to scheduled appt    Take Spiriva 2 puffs a day   Use the albuterol inhaler 2 puffs every 4 hours as needed   I want you to schedule an appt with Dr. Raymondo Band to test your lungs. However, on the day of that appt, hold your Spiriva AND albuterol inhaler   If you haven't already, sign up for My Chart to have easy access to your labs results, and communication with your primary care physician.  I recommend that you always bring your medications to each appointment as this makes it easy to ensure you are on the correct medications and helps [place] not miss refills when you need them. Call the clinic at [phone] if your symptoms worsen or you have any concerns.  You should return to our clinic Return in about 3 months (around 10/09/2022) for HTN, cough. Please arrive 15 minutes before your appointment to ensure smooth check in process.  We appreciate your efforts in making this happen.  Thank you for allowing me to participate in your care, Alfredo Martinez, MD 07/09/2022, 2:21 PM PGY-2, [facility]

## 2022-07-10 ENCOUNTER — Telehealth: Payer: Self-pay

## 2022-07-10 NOTE — Telephone Encounter (Signed)
Spoke with patient. Informed of CT scan at [facility] June 24th at 9:00. Patient understood. Aquilla Solian, CMA

## 2022-08-05 ENCOUNTER — Ambulatory Visit (HOSPITAL_COMMUNITY)
Admission: RE | Admit: 2022-08-05 | Discharge: 2022-08-05 | Disposition: A | Discharge status: Home / Self Care | Payer: Medicare Other | Source: Ambulatory Visit | Attending: Family Medicine | Admitting: Family Medicine

## 2022-08-05 DIAGNOSIS — F17210 Nicotine dependence, cigarettes, uncomplicated: Secondary | ICD-10-CM | POA: Diagnosis not present

## 2022-08-09 ENCOUNTER — Encounter: Payer: Self-pay | Admitting: Student

## 2022-09-27 ENCOUNTER — Encounter: Payer: Self-pay | Admitting: Student

## 2022-09-27 ENCOUNTER — Other Ambulatory Visit: Payer: Self-pay | Admitting: Student

## 2022-09-27 ENCOUNTER — Ambulatory Visit (INDEPENDENT_AMBULATORY_CARE_PROVIDER_SITE_OTHER): Payer: Medicare Other | Admitting: Student

## 2022-09-27 VITALS — BP 122/83 | HR 72 | Ht 70.0 in | Wt 174.4 lb

## 2022-09-27 DIAGNOSIS — I700 Atherosclerosis of aorta: Secondary | ICD-10-CM | POA: Diagnosis not present

## 2022-09-27 DIAGNOSIS — I10 Essential (primary) hypertension: Secondary | ICD-10-CM

## 2022-09-27 DIAGNOSIS — J449 Chronic obstructive pulmonary disease, unspecified: Secondary | ICD-10-CM | POA: Diagnosis not present

## 2022-09-27 DIAGNOSIS — F17210 Nicotine dependence, cigarettes, uncomplicated: Secondary | ICD-10-CM | POA: Diagnosis not present

## 2022-09-27 MED ORDER — ALBUTEROL SULFATE HFA 108 (90 BASE) MCG/ACT IN AERS: 2.0000 | INHALATION_SPRAY | RESPIRATORY_TRACT | 3 refills | Status: AC | PRN

## 2022-09-27 MED ORDER — SPIRIVA RESPIMAT 2.5 MCG/ACT IN AERS: 2.0000 | INHALATION_SPRAY | Freq: Every day | RESPIRATORY_TRACT | 3 refills | Status: AC

## 2022-09-27 NOTE — Progress Notes (Signed)
  SUBJECTIVE:   CHIEF COMPLAINT / HPI:   Hypertension: BP: 122/83 today. Home medications include: hydrochlorothiazide 12.5 mg. He endorses taking these medications as prescribed.   Most recent creatinine trend:  Lab Results  Component Value Date   CREATININE 0.89 04/10/2022   CREATININE 0.92 01/12/2021   CREATININE 0.70 01/11/2021   Patient has had a BMP in the past 1 year.  Concern for COPD: Coughing at night, has phlegm come up--white but has not changed in color or amount.  Worried that the cough awakens him at night and he wheezes  Ongoing for about a month Has been out of his Spiriva and Albuterol Had CT annual lung cancer screen 07/2022--negative  No blood in sputum  No sick contacts  No fever or chills  No dyspnea   GERD: Previously was taking Prilosec, takes this as needed for acid reflux    PERTINENT  PMH / PSH: aortic atherosclerosis--on statin, HTN, GERD, thyroid nodule--benign follicular nodule, HLD, smoker >40 years   Patient Care Team: Alfredo Martinez, MD as PCP - General (Family Medicine) OBJECTIVE:  BP 122/83   Pulse 72   Ht 5\' 10"  (1.778 m)   Wt 174 lb 6 oz (79.1 kg)   SpO2 98%   BMI 25.02 kg/m  Physical Exam  General: Alert and oriented in no apparent distress Heart: Regular rate and rhythm with no murmurs appreciated Lungs: Coarse throughout, expiratory wheeze at bases, normal WOB Skin: Warm and dry  ASSESSMENT/PLAN:  Aortic arch atherosclerosis (HCC) Assessment & Plan: Present on CT scan findings, statin    Chronic obstructive pulmonary disease, unspecified COPD type (HCC) Assessment & Plan: Refilled Spiriva and Albuterol, lack of medication likely causing worsening of COPD symptoms. No evidence of true COPD exacerbation or infectious cause. Uses PRN Prilosec for GERD. Flonase on board, uses daily. CT scan negative previously. Call if symptoms persist despite appropriate inhaler pharmacotherapy.   Orders: -     Albuterol Sulfate HFA; Inhale  2 puffs into the lungs every 4 (four) hours as needed for wheezing or shortness of breath.  Dispense: 1 each; Refill: 3 -     Spiriva Respimat; Inhale 2 puffs into the lungs daily.  Dispense: 1 each; Refill: 3  HYPERTENSION, BENIGN SYSTEMIC Assessment & Plan: On hydrochlorothiazide, controlled. UTD on BMP   Cigarette nicotine dependence without complication Assessment & Plan: Down to three cigarettes daily, stopped Chantix (completed course). Follow up with Koval scheduled.    Colonoscopy was ordered, discussed again on next visit.   Return in about 4 weeks (around 10/25/2022), or if symptoms worsen or fail to improve. Alfredo Martinez, MD 09/27/2022, 9:16 AM PGY-3, [facility]

## 2022-09-27 NOTE — Assessment & Plan Note (Signed)
Refilled Spiriva and Albuterol, lack of medication likely causing worsening of COPD symptoms. No evidence of true COPD exacerbation or infectious cause. Uses PRN Prilosec for GERD. Flonase on board, uses daily. CT scan negative previously. Call if symptoms persist despite appropriate inhaler pharmacotherapy.

## 2022-09-27 NOTE — Assessment & Plan Note (Signed)
On hydrochlorothiazide, controlled. UTD on BMP

## 2022-09-27 NOTE — Assessment & Plan Note (Signed)
Down to three cigarettes daily, stopped Chantix (completed course). Follow up with Koval scheduled.

## 2022-09-27 NOTE — Assessment & Plan Note (Signed)
Present on CT scan findings, statin

## 2022-09-27 NOTE — Patient Instructions (Addendum)
  It was great to see you today! Thank you for choosing [facility] for your primary care.  Today we addressed: Use Albuterol 2 puffs every four hours as needed Spiriva 2 puffs a day Come back in for a visit in 4 weeks if you still have the cough  If you haven't already, sign up for My Chart to have easy access to your labs results, and communication with your primary care physician. I recommend that you always bring your medications to each appointment as this makes it easy to ensure you are on the correct medications and helps [place] not miss refills when you need them. Call the clinic at [phone] if your symptoms worsen or you have any concerns. Return in about 4 weeks (around 10/25/2022), or if symptoms worsen or fail to improve. Please arrive 15 minutes before your appointment to ensure smooth check in process.  We appreciate your efforts in making this happen.  Thank you for allowing me to participate in your care, Alfredo Martinez, MD 09/27/2022, 8:41 AM PGY-3, [facility]

## 2022-10-11 ENCOUNTER — Encounter: Payer: Self-pay | Admitting: Pharmacist

## 2022-10-11 ENCOUNTER — Ambulatory Visit: Payer: Medicare Other | Admitting: Pharmacist

## 2022-10-11 VITALS — BP 126/72 | HR 75 | Ht 70.0 in | Wt 174.6 lb

## 2022-10-11 DIAGNOSIS — F17210 Nicotine dependence, cigarettes, uncomplicated: Secondary | ICD-10-CM | POA: Diagnosis not present

## 2022-10-11 MED ORDER — VARENICLINE TARTRATE 1 MG PO TABS: 1.0000 mg | ORAL_TABLET | Freq: Two times a day (BID) | ORAL | 5 refills | Status: AC

## 2022-10-11 NOTE — Progress Notes (Signed)
Reviewed and agree with Dr Koval's plan.   

## 2022-10-11 NOTE — Assessment & Plan Note (Signed)
Tobacco use disorder with moderate nicotine dependence of 61 years duration in a patient who is fair candidate for success because of current level of motivation and modest success with intake reduction while on varenicline.  Denies any GI intolerance when using all doses of starter pack including 1mg  BID.   - Re-Initiated varenicline 1mg  by mouth once daily with food x 5 days, then 1mg  by mouth twice daily with food thereafter. Patient counseled on purpose, proper use, and potential adverse effects, including GI upset. - Patient established goal of 3 cigarettes or less by return visit 11/01/2022  COPD - no Spirometry identified in chart review.  Consider at future visits to assist with lung evaluation and motivation to quit if lung age is elevated.  -Recently prescribed Spiriva (tiotropium) respimat inhaler.  -Patient educated on purpose, proper use and potential adverse effects.    Following instruction patient verbalized understanding of treatment plan.  Patient was able to demonstrate appropriate technique with deme device in office.

## 2022-10-11 NOTE — Patient Instructions (Signed)
Nice to see you today!   Medication Changes:  Start Varenicline 1mg  once daily with food for 5 days.  Then take Varenicline 1mg  twice daily with food (lunch and dinner).    Start spiriva respimat 2 inhalation daily.   Continue all other medication the same.    Tobacco Patient Instructions  Quitting smoking is one of the most important decisions you can make for your current and future health. Consider what you dislike about smoking and how quitting could personally benefit you. Try to cut down. Aim for reducing the amount you smoke down to 3 or less per day.   My target quit date is: September 2024  Starting today, Be a Quitter!  Remind yourself why you want to quit.  Delay your first cigarette of the day for as long as possible.  Start cleaning out all pockets, drawers, and your car of cigarettes.  Getting Through the Cravings Once You Are Smoke Free: Each craving will last about 10 minutes, whether or not you smoke. Here's how to get through the cravings without cigarettes:  DELAY: Tell yourself that you'll wait for the next craving. Do it every time! DEEP BREATHS: One reason smoking feels good is because you breathe in deeply to inhale. Take four slow, deep breaths and feel the relaxation without the hamful effects of cigarettes. DRINK WATER: Drink a glass of cool water. It will give your hands and mouth something to do and will help flush the nicotine out of your system faster. DIVERT: Do something else -- brush your teeth, take a walk, call a friend who can offer you support. Just moving onto something other than thinking about cigarettes will move you through the craving.   Frequently Asked Questions  What can I do when I get the urge to smoke? To get through the urge to smoke, try the following:  Review your reasons for quitting and think of all the benefits to your health, your finances, and your family.  Remind yourself that there is no such thing as just one cigarette  -- or even one puff.  Ride out the desire to smoke. Use the 4 Os -- Delay, Deep Breaths, Drink Water and Divert to get you through. The craving will go away eventually. Do not fool yourself into thinking you can have just one cigarette.  Any tips on how to deal with stress? Stress is a natural part of life. The key is to deal with it without reaching for a cigarette. Taking deep breaths, counting backwards from 10 and asking yourself 1-how big a deal is this?"  Writing down your feelings, talking with a friend and doing things like positive self-talk and meditation are some other ways that people deal with daily stress.  What if I start smoking again? Slips happen. Most people try to quit smoking a few times before they are successful. Don't beat yourself up if this happens to you! Ask yourself if this was a slip or a relapse. A slip is a one-time mistake that is quickly corrected. A relapse is going back to your old smoking habits.   If you slip, don't give up. Think of it as a learning experience. Ask yourself what went wrong and renew your commitment to staying away from smoking for good.  If you relapse, try not to get discouraged. Ask yourself the question "What caused me to start smoking?" Figure out what helped you and what didn't when you tried to quit. Knowing why you relapsed is useful  information for your next attempt to quit.

## 2022-10-11 NOTE — Progress Notes (Signed)
   S:   Chief Complaint  Patient presents with   Medication Management    Tobacco Intake Reduction/Cessation   72 y.o. male who presents for evaluation/assistance with tobacco dependence.  PMH is significant for COPD, Hypertension.  Patient was referred and last seen by Primary Care Provider, Dr. Jena Gauss, on 09/27/2022.   At last visit, patient noted increase in smoking and inability to quit completely.  He reports he was able to reduce intake to ~ 3 per day.   Age when started using tobacco on a daily basis 10. Number of cigarettes/day 28.  Estimated nicotine content per cigarette (mg) 1.  Estimated nicotine intake per day 30mg .    Most recent quit attempt is within the last year. Longest time ever been tobacco free only while hospitalized.  Medications used in past cessation efforts include: varenicline with some success.   Most common triggers to use tobacco include; Meals, Boredom   Motivation to quit: save money and improve breathing.   O: Clinical ASCVD: Yes  Atherosclerosis noted.  The ASCVD Risk score (Arnett DK, et al., 2019) failed to calculate for the following reasons:   The valid total cholesterol range is 130 to 320 mg/dL  Review of Systems  Respiratory:  Positive for shortness of breath.   All other systems reviewed and are negative.   Physical Exam Vitals reviewed.  Constitutional:      Appearance: Normal appearance.  Pulmonary:     Effort: Pulmonary effort is normal.  Neurological:     Mental Status: He is alert.  Psychiatric:        Mood and Affect: Mood normal.        Thought Content: Thought content normal.        Judgment: Judgment normal.   A/P: Tobacco use disorder with moderate nicotine dependence of 61 years duration in a patient who is fair candidate for success because of current level of motivation and modest success with intake reduction while on varenicline.  Denies any GI intolerance when using all doses of starter pack including 1mg   BID.   - Re-Initiated varenicline 1mg  by mouth once daily with food x 5 days, then 1mg  by mouth twice daily with food thereafter. Patient counseled on purpose, proper use, and potential adverse effects, including GI upset. - Patient established goal of 3 cigarettes or less by return visit 11/01/2022  COPD - no Spirometry identified in chart review.  Consider at future visits to assist with lung evaluation and motivation to quit if lung age is elevated.  -Recently prescribed Spiriva (tiotropium) respimat inhaler.  -Patient educated on purpose, proper use and potential adverse effects.    Following instruction patient verbalized understanding of treatment plan.  Patient was able to demonstrate appropriate technique with deme device in office.  Written patient instructions provided. Patient verbalized understanding of treatment plan.  Total time in face to face counseling 31 minutes.    Follow-up:  Pharmacist 10/31/2024 PCP clinic visit PRN

## 2022-11-01 ENCOUNTER — Ambulatory Visit: Payer: Medicare Other | Admitting: Pharmacist

## 2022-12-06 ENCOUNTER — Ambulatory Visit: Payer: Medicare Other

## 2022-12-06 IMAGING — CT CT CHEST NODULE FOLLOW UP LOW DOSE W/O CM
2 of 3 series · 15 of 36 positions shown, 18 images · non-contrast
Comparison: Lung cancer screening CT dated June 13, 2021

CLINICAL DATA: Lung nodule



[Series 3: l/d nodule f/u 5.0 st · axial · 0.80mm/px · z∈[+992,+1272]mm · 12 of 66 slices shown, 15 images]
[im 5/66  mediastinal]
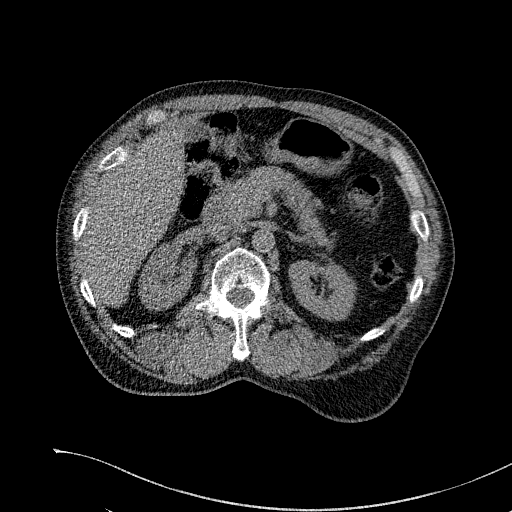
[im 5/66  lung]
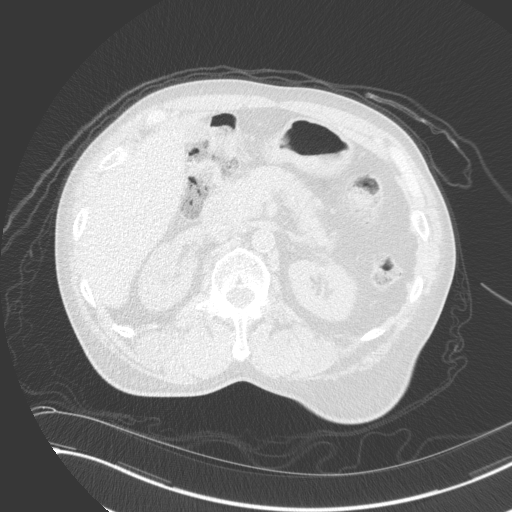
[im 10/66  lung]
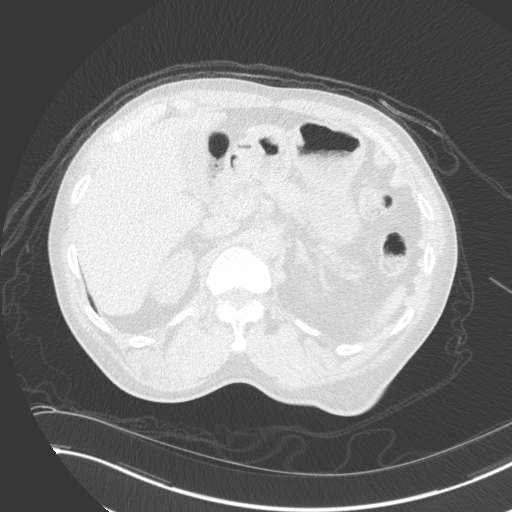
[im 15/66  lung]
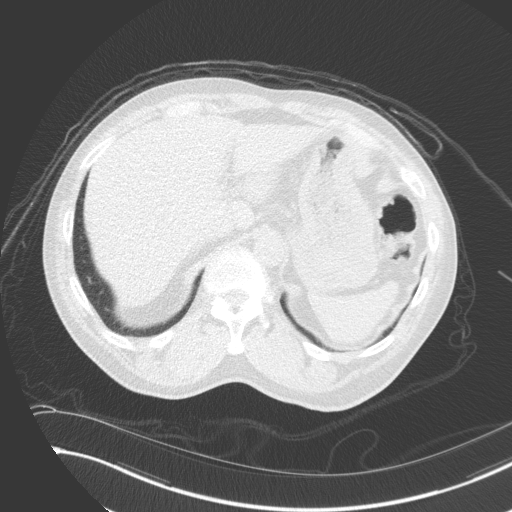
[im 20/66  lung]
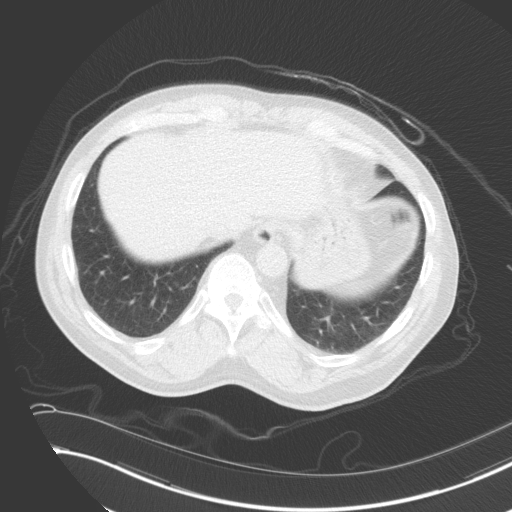
[im 25/66  mediastinal]
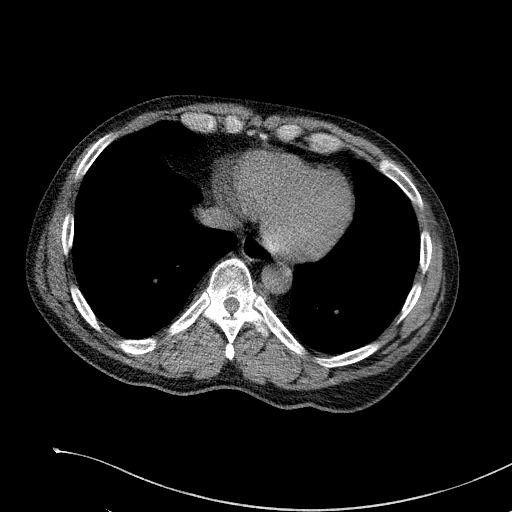
[im 25/66  lung]
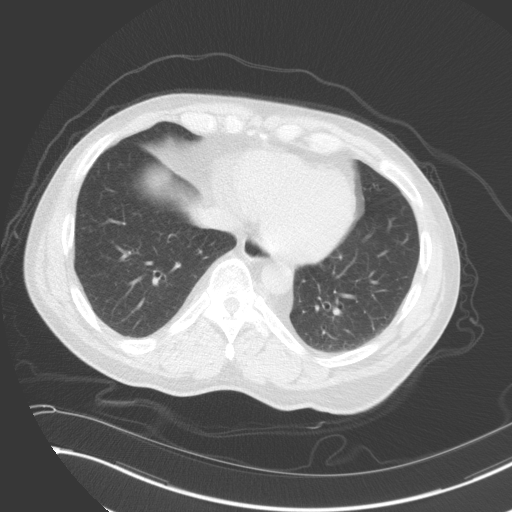
[im 29/66  lung]
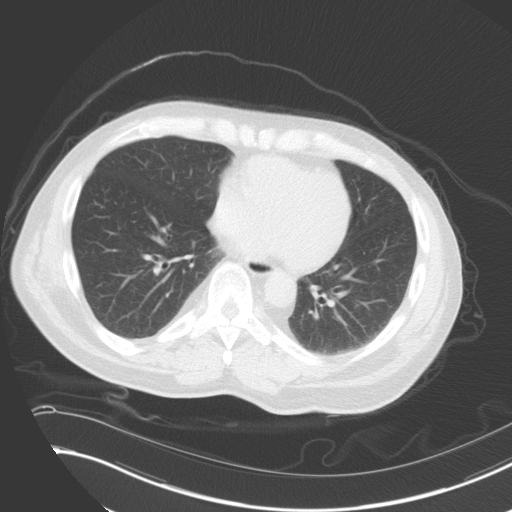
[im 37/66  lung]
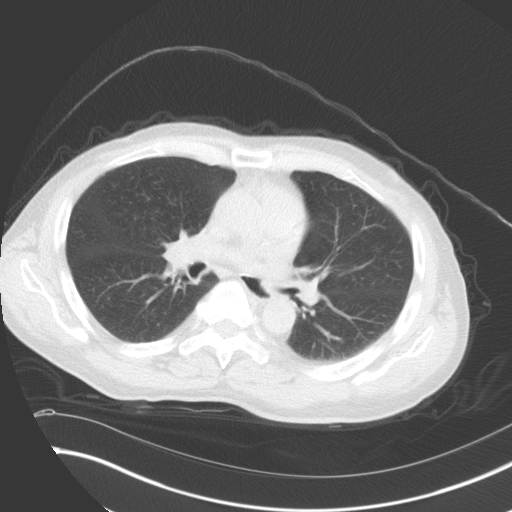
[im 41/66  lung]
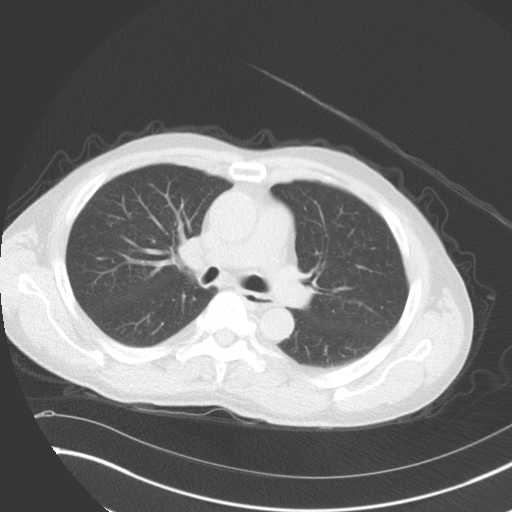
[im 46/66  mediastinal]
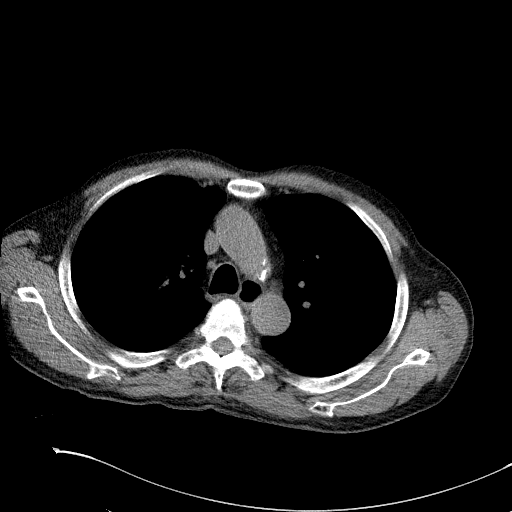
[im 46/66  lung]
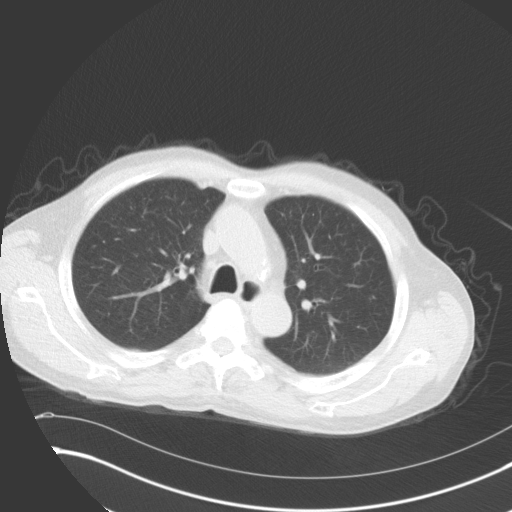
[im 51/66  lung]
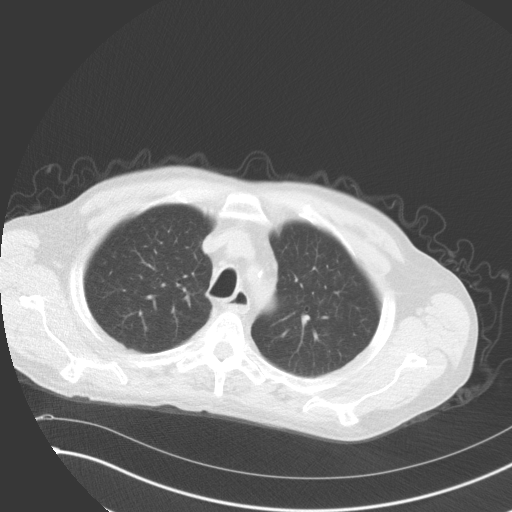
[im 56/66  lung]
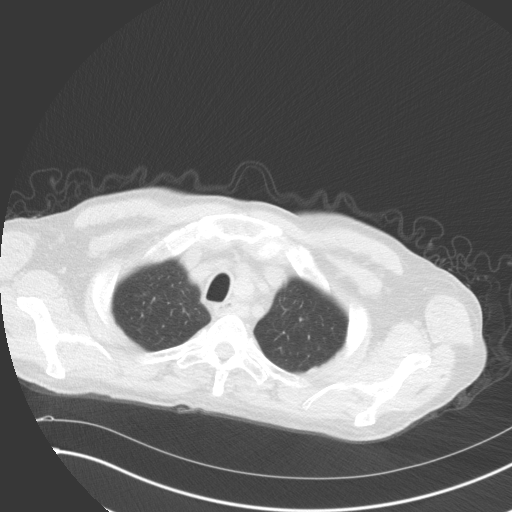
[im 61/66  lung]
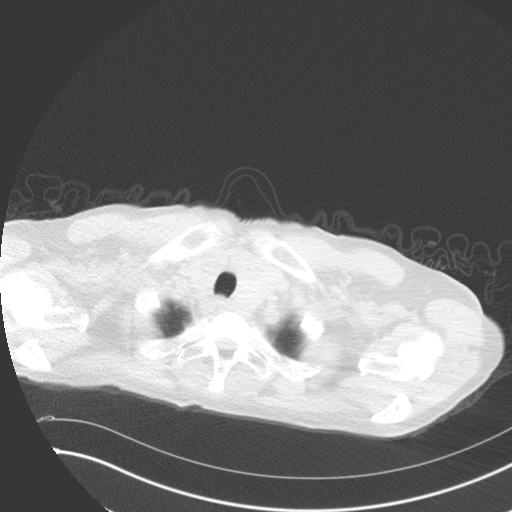

[Series 5: l/d nodule f/u 2.0 cor · coronal · 0.64mm/px · 3 of 135 slices shown]
[im 27/135  lung]
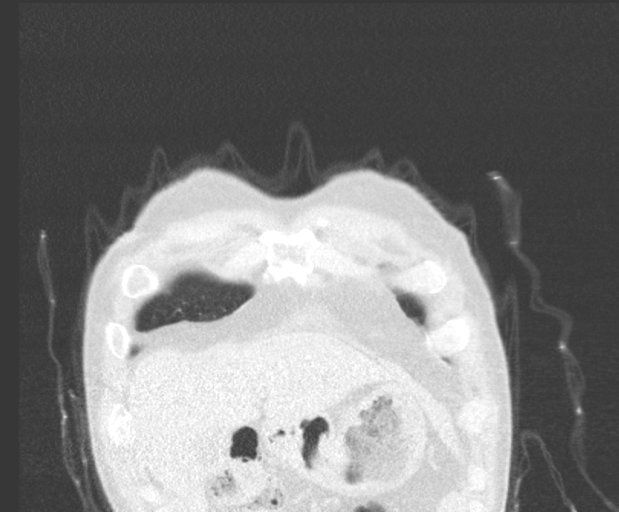
[im 54/135  lung]
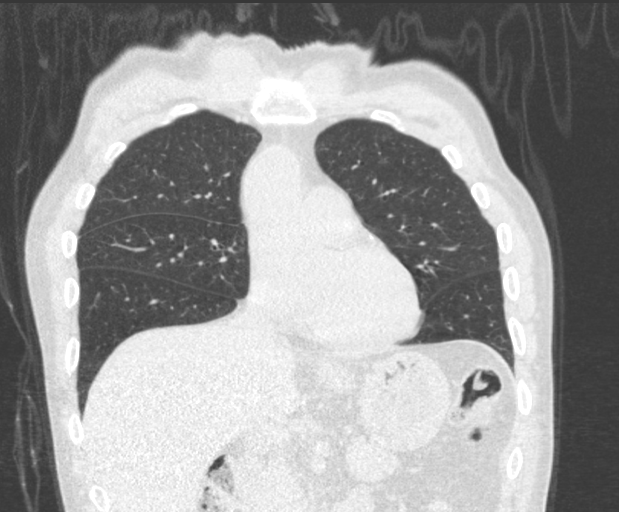
[im 81/135  lung]
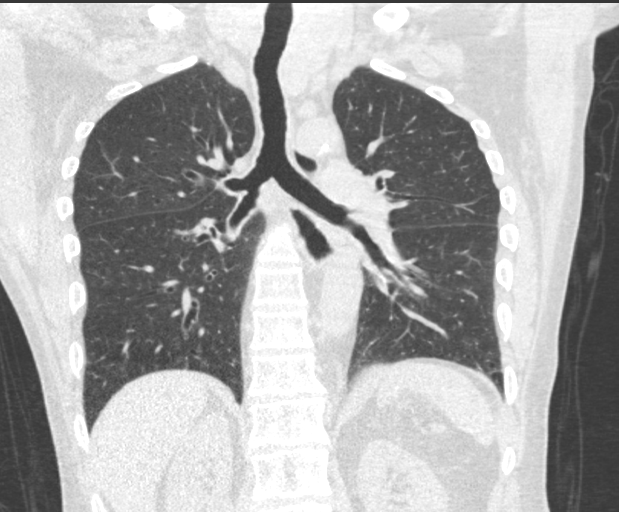

[15 of 36 positions shown; findings below may reference images not displayed]

FINDINGS: Cardiovascular: Heart size. No pericardial effusion. Moderate
three-vessel coronary artery calcifications. Mild atherosclerotic
disease of the thoracic aorta.

Mediastinum/Nodes: Patulous esophagus. Enlarged and heterogeneous
thyroid lobe, unchanged when compared to prior exam. No
pathologically enlarged lymph nodes seen in the chest.

Lungs/Pleura: Central airways are patent. Centrilobular emphysema.
No consolidation, pleural effusion or pneumothorax. Interval
resolution of left lower lobe pulmonary nodule. No new or enlarging
pulmonary nodules.

Upper Abdomen: No acute abnormality.

Musculoskeletal: No chest wall mass or suspicious bone lesions
identified.
IMPRESSION: 1. Interval resolution of left lower lobe pulmonary nodule. No new
or enlarging pulmonary nodules.
2. Unchanged enlarged heterogeneous thyroid lobe, recommend thyroid
ultrasound if not previously performed.
3. Moderate three-vessel coronary artery calcifications.
4. Aortic Atherosclerosis (A6E5N-BDU.U) and Emphysema ([id number].C).

## 2022-12-27 ENCOUNTER — Other Ambulatory Visit: Payer: Self-pay | Admitting: Student

## 2022-12-27 DIAGNOSIS — I10 Essential (primary) hypertension: Secondary | ICD-10-CM

## 2022-12-30 ENCOUNTER — Ambulatory Visit (INDEPENDENT_AMBULATORY_CARE_PROVIDER_SITE_OTHER): Payer: Medicare Other

## 2022-12-30 DIAGNOSIS — Z23 Encounter for immunization: Secondary | ICD-10-CM | POA: Diagnosis not present

## 2022-12-30 NOTE — Progress Notes (Signed)
Patient presents to nurse clinic for Flu vaccine.  Vaccine administered without complication.  See admin for details.

## 2023-01-23 ENCOUNTER — Ambulatory Visit: Payer: Medicare Other

## 2023-03-28 ENCOUNTER — Ambulatory Visit: Payer: Medicare Other | Admitting: Student

## 2023-03-28 ENCOUNTER — Encounter: Payer: Self-pay | Admitting: Student

## 2023-03-28 VITALS — BP 130/68 | HR 78 | Ht 70.0 in | Wt 171.5 lb

## 2023-03-28 DIAGNOSIS — K219 Gastro-esophageal reflux disease without esophagitis: Secondary | ICD-10-CM

## 2023-03-28 DIAGNOSIS — Z23 Encounter for immunization: Secondary | ICD-10-CM

## 2023-03-28 DIAGNOSIS — T7840XD Allergy, unspecified, subsequent encounter: Secondary | ICD-10-CM

## 2023-03-28 DIAGNOSIS — F17210 Nicotine dependence, cigarettes, uncomplicated: Secondary | ICD-10-CM

## 2023-03-28 MED ORDER — FLUTICASONE PROPIONATE 50 MCG/ACT NA SUSP: 2.0000 | Freq: Every day | NASAL | 0 refills | Status: DC

## 2023-03-28 MED ORDER — BUPROPION HCL ER (SR) 150 MG PO TB12: 150.0000 mg | ORAL_TABLET | Freq: Two times a day (BID) | ORAL | 0 refills | Status: AC

## 2023-03-28 NOTE — Assessment & Plan Note (Signed)
Given presentation and history, high suspicion that GERD is cause of transitory symptoms. Strict return precautions if symptoms recur despite Tums treatment. Discussed dietary habit changes. Smoking cessation will also help. PPI and Tums.

## 2023-03-28 NOTE — Patient Instructions (Addendum)
It was great to see you today! Thank you for choosing [facility] for your primary care.  Today we addressed: Please start Welbutrin. 150 mg orally once daily in the morning for 3 days, then increase to 150 mg orally twice daily Stop chantix  target quit date within the first 2 weeks of use; plan to continue treatment for 7 to 12 weeks  Goal of 3 cigarettes or less by return visit  No need for aspirin   If you haven't already, sign up for My Chart to have easy access to your labs results, and communication with your primary care physician.   Please arrive 15 minutes before your appointment to ensure smooth check in process.  We appreciate your efforts in making this happen.  Thank you for allowing me to participate in your care, Alfredo Martinez, MD 03/28/2023, 9:11 AM PGY-3, [facility]

## 2023-03-28 NOTE — Progress Notes (Deleted)
    SUBJECTIVE:   CHIEF COMPLAINT / HPI:   Side Pain: - Ongoing *** - No fever or chills   Half a pack per day    PERTINENT  PMH / PSH:  aortic atherosclerosis--on statin  HTN GERD--prilosec thyroid nodule--benign follicular nodule HLD- LDL 69 04/2022 smoker >40 years -- CT annual lung cancer screen 07/2022--negative   OBJECTIVE:   BP 130/68   Pulse 78   Ht 5\' 10"  (1.778 m)   Wt 171 lb 8 oz (77.8 kg)   SpO2 96%   BMI 24.61 kg/m   General: Alert and oriented in no apparent distress Heart: Regular rate and rhythm with no murmurs appreciated Lungs: CTA bilaterally, no wheezing Abdomen: Bowel sounds present, no abdominal pain Skin: Warm and dry Extremities: No lower extremity edema   ASSESSMENT/PLAN:   Assessment & Plan      Alfredo Martinez, MD [facility]

## 2023-03-28 NOTE — Assessment & Plan Note (Signed)
Trial Welbutrin. 150 mg orally once daily in the morning for 3 days, then increase to 150 mg orally twice daily. Stop chantix. Target quit date within the first 2 weeks of use; plan to continue treatment for 7 to 12 weeks. Goal of 3 cigarettes or less by return visit. Scheduled return visit in office

## 2023-03-28 NOTE — Progress Notes (Signed)
      SUBJECTIVE:   CHIEF COMPLAINT / HPI:   Side Pain Concern for acid reflux, burning in chest: - happened one week ago but not since then - pt reports that he had chest burning with radiation down to the abdomen and right flank  - Currently with NO symptoms of pain - Took a Tums afterward with complete resolution - No fever or chills  - No chest pain or dyspnea  - This pain always occurs after fatty foods/fried foods   Tobacco Use:  - Smoker x 40 years  - Currently smokes 1/2 PPD  - Motivated to quit  - Tried chantix with Raymondo Band, unable to quit and stopped it as it did not help - Tried patches and gum without ability to stop  PERTINENT  PMH / PSH:  aortic atherosclerosis--on statin  HTN GERD--prilosec thyroid nodule--benign follicular nodule HLD- LDL 69 04/2022 smoker >40 years -- CT annual lung cancer screen 07/2022--negative   OBJECTIVE:   BP 130/68   Pulse 78   Ht 5\' 10"  (1.778 m)   Wt 171 lb 8 oz (77.8 kg)   SpO2 96%   BMI 24.61 kg/m   General: Alert and oriented in no apparent distress Heart: Regular rate and rhythm with no murmurs appreciated Lungs: CTA bilaterally, no wheezing Abdomen: no abdominal pain Skin: Warm and dry   ASSESSMENT/PLAN:   Assessment & Plan Cigarette nicotine dependence without complication Trial Welbutrin. 150 mg orally once daily in the morning for 3 days, then increase to 150 mg orally twice daily. Stop chantix. Target quit date within the first 2 weeks of use; plan to continue treatment for 7 to 12 weeks. Goal of 3 cigarettes or less by return visit. Scheduled return visit in office  Gastroesophageal reflux disease, unspecified whether esophagitis present Given presentation and history, high suspicion that GERD is cause of transitory symptoms. Strict return precautions if symptoms recur despite Tums treatment. Discussed dietary habit changes. Smoking cessation will also help. PPI and Tums.      Clifford Martinez, MD [facility]  [facility]

## 2023-04-26 ENCOUNTER — Other Ambulatory Visit: Payer: Self-pay | Admitting: Student

## 2023-04-26 DIAGNOSIS — T7840XD Allergy, unspecified, subsequent encounter: Secondary | ICD-10-CM

## 2023-04-29 ENCOUNTER — Ambulatory Visit: Payer: Self-pay | Admitting: Student

## 2023-04-29 NOTE — Progress Notes (Deleted)
    SUBJECTIVE:   CHIEF COMPLAINT / HPI:   Tobacco Use:  - Smoker x 40 years  - Trialed Chantix with [place], unable to quit  - Has tried patches and gum  - Started a trial of Welbutrin on last visit   PERTINENT  PMH / PSH:  aortic atherosclerosis--on statin  HTN GERD--prilosec thyroid nodule--benign follicular nodule HLD- LDL 69 04/2022 smoker >40 years -- CT annual lung cancer screen 07/2022--negative   OBJECTIVE:   There were no vitals taken for this visit.  General: Alert and oriented in no apparent distress Heart: Regular rate and rhythm with no murmurs appreciated Lungs: CTA bilaterally, no wheezing Abdomen: Bowel sounds present, no abdominal pain Skin: Warm and dry Extremities: No lower extremity edema   ASSESSMENT/PLAN:   Assessment & Plan      Clifford Martinez, MD [facility]

## 2023-06-29 ENCOUNTER — Other Ambulatory Visit: Payer: Self-pay | Admitting: Student

## 2023-06-29 DIAGNOSIS — I10 Essential (primary) hypertension: Secondary | ICD-10-CM

## 2023-06-29 DIAGNOSIS — E7800 Pure hypercholesterolemia, unspecified: Secondary | ICD-10-CM

## 2024-01-05 ENCOUNTER — Other Ambulatory Visit: Payer: Self-pay

## 2024-01-05 DIAGNOSIS — I10 Essential (primary) hypertension: Secondary | ICD-10-CM

## 2024-01-05 MED ORDER — HYDROCHLOROTHIAZIDE 12.5 MG PO TABS: 12.5000 mg | ORAL_TABLET | Freq: Every day | ORAL | 1 refills | Status: AC
